# Patient Record
Sex: Male | Born: 1980 | Race: White | Hispanic: No | Smoking: Former smoker
Health system: Southern US, Community
[De-identification: ages and names within clinical notes are randomized; demographics above are authoritative.]

## PROBLEM LIST (undated history)

## (undated) DIAGNOSIS — F32A Depression, unspecified: Secondary | ICD-10-CM

## (undated) DIAGNOSIS — R569 Unspecified convulsions: Secondary | ICD-10-CM

## (undated) DIAGNOSIS — F419 Anxiety disorder, unspecified: Secondary | ICD-10-CM

## (undated) DIAGNOSIS — F329 Major depressive disorder, single episode, unspecified: Secondary | ICD-10-CM

## (undated) HISTORY — PX: OTHER SURGICAL HISTORY: SHX169

---

## 2003-07-07 ENCOUNTER — Other Ambulatory Visit: Payer: Self-pay

## 2005-02-12 ENCOUNTER — Emergency Department: Payer: Self-pay | Admitting: Unknown Physician Specialty

## 2005-03-05 ENCOUNTER — Inpatient Hospital Stay: Payer: Self-pay | Admitting: Psychiatry

## 2005-04-12 ENCOUNTER — Emergency Department: Payer: Self-pay | Admitting: General Practice

## 2006-05-25 ENCOUNTER — Emergency Department: Payer: Self-pay | Admitting: Internal Medicine

## 2006-05-25 ENCOUNTER — Other Ambulatory Visit: Payer: Self-pay

## 2007-05-17 ENCOUNTER — Other Ambulatory Visit: Payer: Self-pay

## 2007-05-17 ENCOUNTER — Emergency Department: Payer: Self-pay | Admitting: Emergency Medicine

## 2007-12-04 ENCOUNTER — Emergency Department: Payer: Self-pay | Admitting: Emergency Medicine

## 2008-12-02 ENCOUNTER — Emergency Department: Payer: Self-pay | Admitting: Internal Medicine

## 2009-11-30 ENCOUNTER — Emergency Department: Payer: Self-pay | Admitting: Emergency Medicine

## 2010-05-03 ENCOUNTER — Emergency Department: Payer: Self-pay | Admitting: Emergency Medicine

## 2010-10-14 ENCOUNTER — Emergency Department: Payer: Self-pay | Admitting: *Deleted

## 2011-02-17 ENCOUNTER — Emergency Department: Payer: Self-pay | Admitting: Unknown Physician Specialty

## 2012-08-21 ENCOUNTER — Emergency Department: Payer: Self-pay | Admitting: Emergency Medicine

## 2012-08-21 LAB — COMPREHENSIVE METABOLIC PANEL
Albumin: 4.2 g/dL (ref 3.4–5.0)
Alkaline Phosphatase: 79 U/L (ref 50–136)
BUN: 12 mg/dL (ref 7–18)
Bilirubin,Total: 0.3 mg/dL (ref 0.2–1.0)
Chloride: 109 mmol/L — ABNORMAL HIGH (ref 98–107)
Co2: 25 mmol/L (ref 21–32)
EGFR (African American): >60
Glucose: 105 mg/dL — ABNORMAL HIGH (ref 65–99)
Osmolality: 281 (ref 275–301)
Potassium: 3.2 mmol/L — ABNORMAL LOW (ref 3.5–5.1)
SGPT (ALT): 24 U/L (ref 12–78)
Sodium: 141 mmol/L (ref 136–145)

## 2012-08-21 LAB — CBC
HGB: 13.9 g/dL (ref 13.0–18.0)
MCHC: 34.5 g/dL (ref 32.0–36.0)
RBC: 4.66 10*6/uL (ref 4.40–5.90)
RDW: 13.2 % (ref 11.5–14.5)

## 2012-08-22 LAB — URINALYSIS, COMPLETE
Glucose,UR: NEGATIVE mg/dL (ref 0–75)
Ketone: NEGATIVE
Leukocyte Esterase: NEGATIVE
Nitrite: NEGATIVE
Ph: 6 (ref 4.5–8.0)
Protein: NEGATIVE
WBC UR: <1 /HPF (ref 0–5)

## 2012-08-22 LAB — DRUG SCREEN, URINE
Barbiturates, Ur Screen: NEGATIVE (ref ?–200)
Benzodiazepine, Ur Scrn: NEGATIVE (ref ?–200)
Cocaine Metabolite,Ur ~~LOC~~: NEGATIVE (ref ?–300)
Methadone, Ur Screen: NEGATIVE (ref ?–300)
Opiate, Ur Screen: NEGATIVE (ref ?–300)
Phencyclidine (PCP) Ur S: NEGATIVE (ref ?–25)

## 2013-01-24 ENCOUNTER — Emergency Department: Payer: Self-pay | Admitting: Emergency Medicine

## 2013-01-24 LAB — CBC
HCT: 42 % (ref 40.0–52.0)
HGB: 14.5 g/dL (ref 13.0–18.0)
MCHC: 34.6 g/dL (ref 32.0–36.0)
MCV: 87 fL (ref 80–100)
Platelet: 251 10*3/uL (ref 150–440)
RBC: 4.84 10*6/uL (ref 4.40–5.90)
RDW: 13.2 % (ref 11.5–14.5)
WBC: 7.9 10*3/uL (ref 3.8–10.6)

## 2013-01-24 LAB — BASIC METABOLIC PANEL
BUN: 9 mg/dL (ref 7–18)
Calcium, Total: 8.3 mg/dL — ABNORMAL LOW (ref 8.5–10.1)
Co2: 25 mmol/L (ref 21–32)
Creatinine: 1.15 mg/dL (ref 0.60–1.30)
EGFR (African American): >60
EGFR (Non-African Amer.): >60
Glucose: 114 mg/dL — ABNORMAL HIGH (ref 65–99)
Potassium: 3.3 mmol/L — ABNORMAL LOW (ref 3.5–5.1)
Sodium: 138 mmol/L (ref 136–145)
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
## 2014-06-13 NOTE — Consult Note (Signed)
PATIENT NAME:  Nicholas Reese, [name] MR#:  [id number] DATE OF BIRTH:  02/06/1981  DATE OF CONSULTATION:  08/22/2012  REFERRING PHYSICIAN:   CONSULTING PHYSICIAN:  Izola PriceFrances C. Jaclynn MajorGreason, MD  ADDENDUM:   PLAN:  Mr. Fletcher AnonChristopher Perry Vicars will be discharged home to his wife, who is satisfied with that. She says that what she believes is he needs outpatient follow-up. She does not think he is suicidal. She will help him with all of this disposition. We have helped him with appointment follow-up.    ____________________________ Izola PriceFrances C. Jaclynn MajorGreason, MD fcg:cc D: 08/22/2012 15:50:44 ET T: 08/22/2012 17:04:26 ET JOB#: [id number]  cc: Izola PriceFrances C. Jaclynn MajorGreason, MD, <Dictator> Maryan PulsFRANCES C Denasia Venn MD ELECTRONICALLY SIGNED 08/23/2012 13:23
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
## 2015-01-07 ENCOUNTER — Emergency Department
Admission: EM | Admit: 2015-01-07 | Discharge: 2015-01-07 | Disposition: A | Discharge status: Home / Self Care | Payer: No Typology Code available for payment source | Attending: Emergency Medicine | Admitting: Emergency Medicine

## 2015-01-07 ENCOUNTER — Emergency Department: Payer: No Typology Code available for payment source

## 2015-01-07 DIAGNOSIS — S3992XD Unspecified injury of lower back, subsequent encounter: Secondary | ICD-10-CM | POA: Diagnosis present

## 2015-01-07 DIAGNOSIS — Z87891 Personal history of nicotine dependence: Secondary | ICD-10-CM | POA: Diagnosis not present

## 2015-01-07 DIAGNOSIS — S4991XD Unspecified injury of right shoulder and upper arm, subsequent encounter: Secondary | ICD-10-CM | POA: Diagnosis not present

## 2015-01-07 DIAGNOSIS — M7918 Myalgia, other site: Secondary | ICD-10-CM
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
## 2015-01-07 MED ORDER — CYCLOBENZAPRINE HCL 10 MG PO TABS: 10.0000 mg | ORAL_TABLET | Freq: Three times a day (TID) | ORAL | Status: DC | PRN

## 2015-01-07 MED ORDER — TRAMADOL HCL 50 MG PO TABS: 50.0000 mg | ORAL_TABLET | Freq: Four times a day (QID) | ORAL | Status: DC | PRN
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
## 2015-01-07 NOTE — Discharge Instructions (Signed)

## 2015-11-02 ENCOUNTER — Encounter: Payer: Self-pay | Admitting: Emergency Medicine

## 2015-11-02 ENCOUNTER — Emergency Department
Admission: EM | Admit: 2015-11-02 | Discharge: 2015-11-02 | Disposition: A | Discharge status: Home / Self Care | Payer: Self-pay | Attending: Emergency Medicine | Admitting: Emergency Medicine

## 2015-11-02 ENCOUNTER — Emergency Department: Payer: Self-pay

## 2015-11-02 DIAGNOSIS — R569 Unspecified convulsions: Secondary | ICD-10-CM

## 2015-11-02 DIAGNOSIS — F419 Anxiety disorder, unspecified: Secondary | ICD-10-CM | POA: Insufficient documentation

## 2015-11-02 DIAGNOSIS — R0789 Other chest pain: Secondary | ICD-10-CM | POA: Insufficient documentation

## 2015-11-02 DIAGNOSIS — G40909 Epilepsy, unspecified, not intractable, without status epilepticus: Secondary | ICD-10-CM | POA: Insufficient documentation

## 2015-11-02 DIAGNOSIS — F17210 Nicotine dependence, cigarettes, uncomplicated: Secondary | ICD-10-CM | POA: Insufficient documentation

## 2015-11-02 HISTORY — DX: Anxiety disorder, unspecified: F41.9

## 2015-11-02 HISTORY — DX: Unspecified convulsions: R56.9

## 2015-11-02 LAB — COMPREHENSIVE METABOLIC PANEL
ALBUMIN: 4.4 g/dL (ref 3.5–5.0)
ALK PHOS: 57 U/L (ref 38–126)
ALT: 14 U/L — AB (ref 17–63)
AST: 20 U/L (ref 15–41)
Anion gap: 5 (ref 5–15)
BUN: 12 mg/dL (ref 6–20)
CALCIUM: 8.8 mg/dL — AB (ref 8.9–10.3)
CO2: 25 mmol/L (ref 22–32)
CREATININE: 1.05 mg/dL (ref 0.61–1.24)
Chloride: 107 mmol/L (ref 101–111)
GFR calc Af Amer: >60 mL/min (ref >60)
GFR calc non Af Amer: >60 mL/min (ref >60)
GLUCOSE: 96 mg/dL (ref 65–99)
Potassium: 3.3 mmol/L — ABNORMAL LOW (ref 3.5–5.1)
SODIUM: 137 mmol/L (ref 135–145)
Total Bilirubin: 0.5 mg/dL (ref 0.3–1.2)
Total Protein: 6.9 g/dL (ref 6.5–8.1)

## 2015-11-02 LAB — CBC
HEMATOCRIT: 43.4 % (ref 40.0–52.0)
HEMOGLOBIN: 14.9 g/dL (ref 13.0–18.0)
MCH: 32.9 pg (ref 26.0–34.0)
MCHC: 34.3 g/dL (ref 32.0–36.0)
MCV: 96 fL (ref 80.0–100.0)
Platelets: 217 10*3/uL (ref 150–440)
RBC: 4.52 MIL/uL (ref 4.40–5.90)
RDW: 14.1 % (ref 11.5–14.5)
WBC: 4.9 10*3/uL (ref 3.8–10.6)

## 2015-11-02 LAB — TROPONIN I: Troponin I: <0.03 ng/mL (ref <0.03)
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
## 2015-11-02 MED ORDER — LEVETIRACETAM 500 MG PO TABS
1000.0000 mg | ORAL_TABLET | Freq: Once | ORAL | Status: AC
  Administered 2015-11-02: 1000 mg via ORAL
  Filled 2015-11-02: qty 2

## 2015-11-02 MED ORDER — LORAZEPAM 1 MG PO TABS: 1.0000 mg | ORAL_TABLET | Freq: Two times a day (BID) | ORAL | 0 refills | Status: AC

## 2015-11-02 MED ORDER — LORAZEPAM 2 MG/ML IJ SOLN
1.0000 mg | Freq: Once | INTRAMUSCULAR | Status: AC
  Administered 2015-11-02: 1 mg via INTRAVENOUS
  Filled 2015-11-02: qty 1

## 2015-11-02 MED ORDER — LEVETIRACETAM 500 MG PO TABS: 500.0000 mg | ORAL_TABLET | Freq: Two times a day (BID) | ORAL | 2 refills | Status: DC
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
## 2015-11-02 NOTE — ED Triage Notes (Signed)
Patient presents to the ED stating, "I think I had a seizure."  Patient reports history of seizures but states he hasn't had one in 10 years.  Patient states he stopped taking Keppra 2 months ago.  Patient is also complaining of chest pain.  Patient states no one witnessed the seizure but that patient felt his muscles had gotten very tight and patient was incontinent of urine.

## 2015-12-21 ENCOUNTER — Emergency Department: Payer: Self-pay

## 2015-12-21 ENCOUNTER — Encounter: Payer: Self-pay | Admitting: Emergency Medicine

## 2015-12-21 ENCOUNTER — Emergency Department
Admission: EM | Admit: 2015-12-21 | Discharge: 2015-12-21 | Disposition: A | Discharge status: Home / Self Care | Payer: Self-pay | Attending: Emergency Medicine | Admitting: Emergency Medicine

## 2015-12-21 DIAGNOSIS — R569 Unspecified convulsions: Secondary | ICD-10-CM

## 2015-12-21 DIAGNOSIS — R079 Chest pain, unspecified: Secondary | ICD-10-CM | POA: Insufficient documentation

## 2015-12-21 DIAGNOSIS — G40909 Epilepsy, unspecified, not intractable, without status epilepticus: Secondary | ICD-10-CM | POA: Insufficient documentation

## 2015-12-21 DIAGNOSIS — Z87891 Personal history of nicotine dependence: Secondary | ICD-10-CM | POA: Insufficient documentation

## 2015-12-21 LAB — CBC
HEMATOCRIT: 42.5 % (ref 40.0–52.0)
HEMOGLOBIN: 14.8 g/dL (ref 13.0–18.0)
MCH: 32.6 pg (ref 26.0–34.0)
MCHC: 34.7 g/dL (ref 32.0–36.0)
MCV: 93.9 fL (ref 80.0–100.0)
Platelets: 234 10*3/uL (ref 150–440)
RBC: 4.53 MIL/uL (ref 4.40–5.90)
RDW: 13.3 % (ref 11.5–14.5)
WBC: 5.4 10*3/uL (ref 3.8–10.6)

## 2015-12-21 LAB — COMPREHENSIVE METABOLIC PANEL
ALBUMIN: 4.6 g/dL (ref 3.5–5.0)
ALT: 14 U/L — AB (ref 17–63)
AST: 19 U/L (ref 15–41)
Alkaline Phosphatase: 71 U/L (ref 38–126)
Anion gap: 6 (ref 5–15)
BUN: 11 mg/dL (ref 6–20)
CHLORIDE: 108 mmol/L (ref 101–111)
CO2: 23 mmol/L (ref 22–32)
CREATININE: 0.93 mg/dL (ref 0.61–1.24)
Calcium: 9 mg/dL (ref 8.9–10.3)
GFR calc non Af Amer: >60 mL/min (ref >60)
GLUCOSE: 99 mg/dL (ref 65–99)
Potassium: 3.7 mmol/L (ref 3.5–5.1)
SODIUM: 137 mmol/L (ref 135–145)
Total Bilirubin: 0.3 mg/dL (ref 0.3–1.2)
Total Protein: 7.5 g/dL (ref 6.5–8.1)

## 2015-12-21 LAB — TROPONIN I
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
## 2015-12-21 MED ORDER — LEVETIRACETAM 500 MG PO TABS
1000.0000 mg | ORAL_TABLET | Freq: Once | ORAL | Status: AC
  Administered 2015-12-21: 1000 mg via ORAL
  Filled 2015-12-21: qty 2

## 2015-12-21 MED ORDER — PHENYTOIN SODIUM EXTENDED 100 MG PO CAPS: 100.0000 mg | ORAL_CAPSULE | Freq: Three times a day (TID) | ORAL | 2 refills | Status: DC

## 2015-12-21 NOTE — ED Triage Notes (Signed)
Reports "a few seizures last night and one this morning".  This morning patient lost control of bladder with seizure.  Patient reports he has been having seizures for the past couple of weeks,  More frequent in the evenings.  Patient has been off of seizure medications for about two weeks due to loss of insurance.    Patient had been prescribed 1000 mg Keppra BID
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
## 2016-06-20 ENCOUNTER — Emergency Department
Admission: EM | Admit: 2016-06-20 | Discharge: 2016-06-20 | Disposition: A | Discharge status: Home / Self Care | Payer: Self-pay | Attending: Emergency Medicine | Admitting: Emergency Medicine

## 2016-06-20 ENCOUNTER — Emergency Department: Payer: Self-pay

## 2016-06-20 ENCOUNTER — Encounter: Payer: Self-pay | Admitting: Emergency Medicine

## 2016-06-20 DIAGNOSIS — R55 Syncope and collapse: Secondary | ICD-10-CM | POA: Insufficient documentation

## 2016-06-20 DIAGNOSIS — R0789 Other chest pain: Secondary | ICD-10-CM | POA: Insufficient documentation

## 2016-06-20 DIAGNOSIS — R079 Chest pain, unspecified: Secondary | ICD-10-CM

## 2016-06-20 LAB — TROPONIN I: Troponin I: <0.03 ng/mL (ref <0.03)

## 2016-06-20 LAB — CBC
HCT: 39.4 % — ABNORMAL LOW (ref 40.0–52.0)
Hemoglobin: 13.4 g/dL (ref 13.0–18.0)
MCH: 33.5 pg (ref 26.0–34.0)
MCHC: 34.1 g/dL (ref 32.0–36.0)
MCV: 98.1 fL (ref 80.0–100.0)
PLATELETS: 214 10*3/uL (ref 150–440)
RBC: 4.01 MIL/uL — AB (ref 4.40–5.90)
RDW: 13.8 % (ref 11.5–14.5)
WBC: 5.1 10*3/uL (ref 3.8–10.6)

## 2016-06-20 LAB — BASIC METABOLIC PANEL
Anion gap: 5 (ref 5–15)
BUN: 12 mg/dL (ref 6–20)
CALCIUM: 8.6 mg/dL — AB (ref 8.9–10.3)
CO2: 28 mmol/L (ref 22–32)
Chloride: 106 mmol/L (ref 101–111)
Creatinine, Ser: 1.01 mg/dL (ref 0.61–1.24)
GFR calc non Af Amer: >60 mL/min (ref >60)
GLUCOSE: 108 mg/dL — AB (ref 65–99)
Potassium: 3.6 mmol/L (ref 3.5–5.1)
Sodium: 139 mmol/L (ref 135–145)

## 2016-06-20 LAB — MAGNESIUM: Magnesium: 1.9 mg/dL (ref 1.7–2.4)

## 2016-06-20 LAB — TSH: TSH: 1.376 u[IU]/mL (ref 0.350–4.500)

## 2016-06-20 IMAGING — CR DG CHEST 2V
1 series · 2 of 2 positions shown · non-contrast
Comparison: [DATE]

CLINICAL DATA: Chest pain for a week, worsened in the past 24
hours.

EXAM:
CHEST  2 VIEW

[Series 1: dg chest 2 view · 0.14mm/px · 2 of 2 slices shown]
[im 1/2]
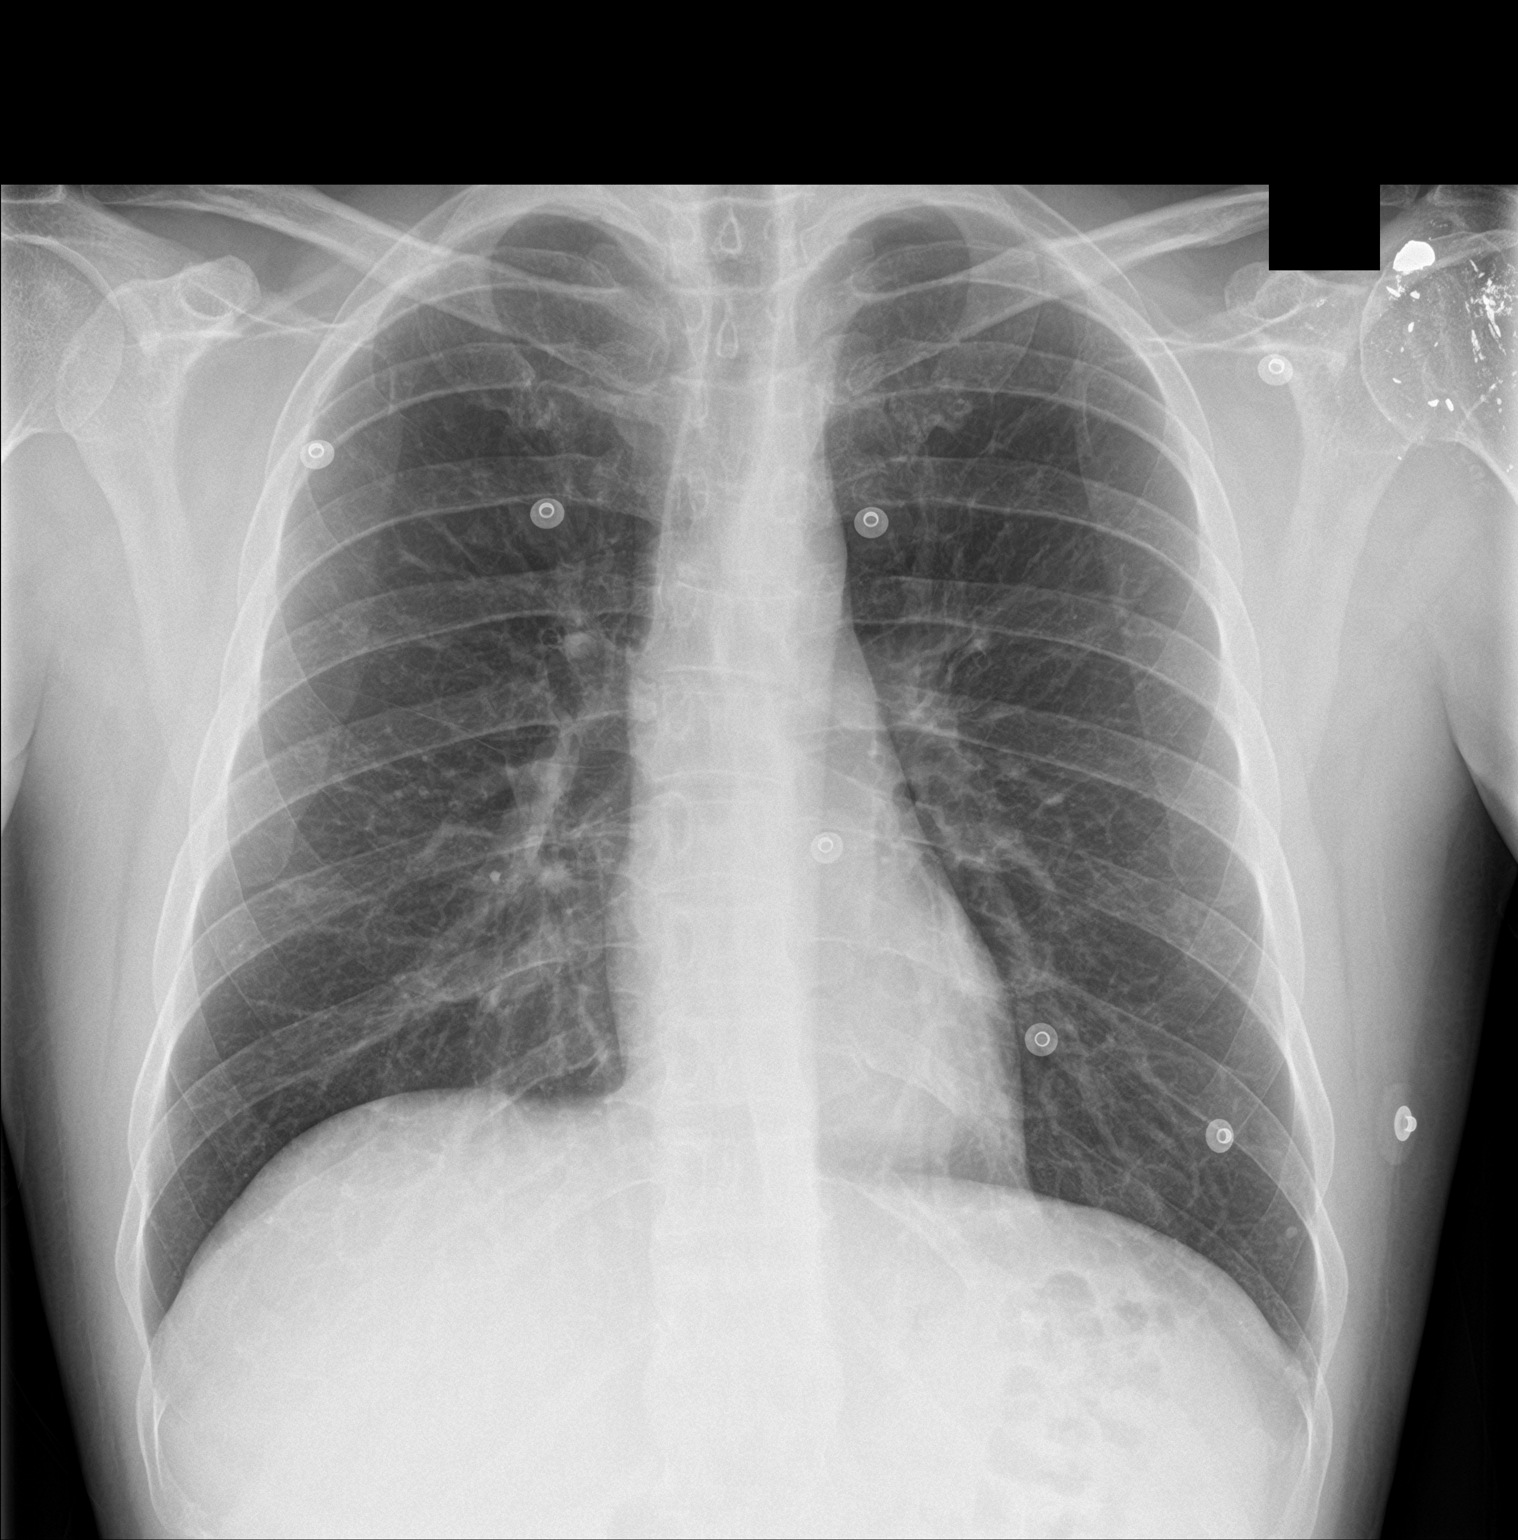
[im 2/2]
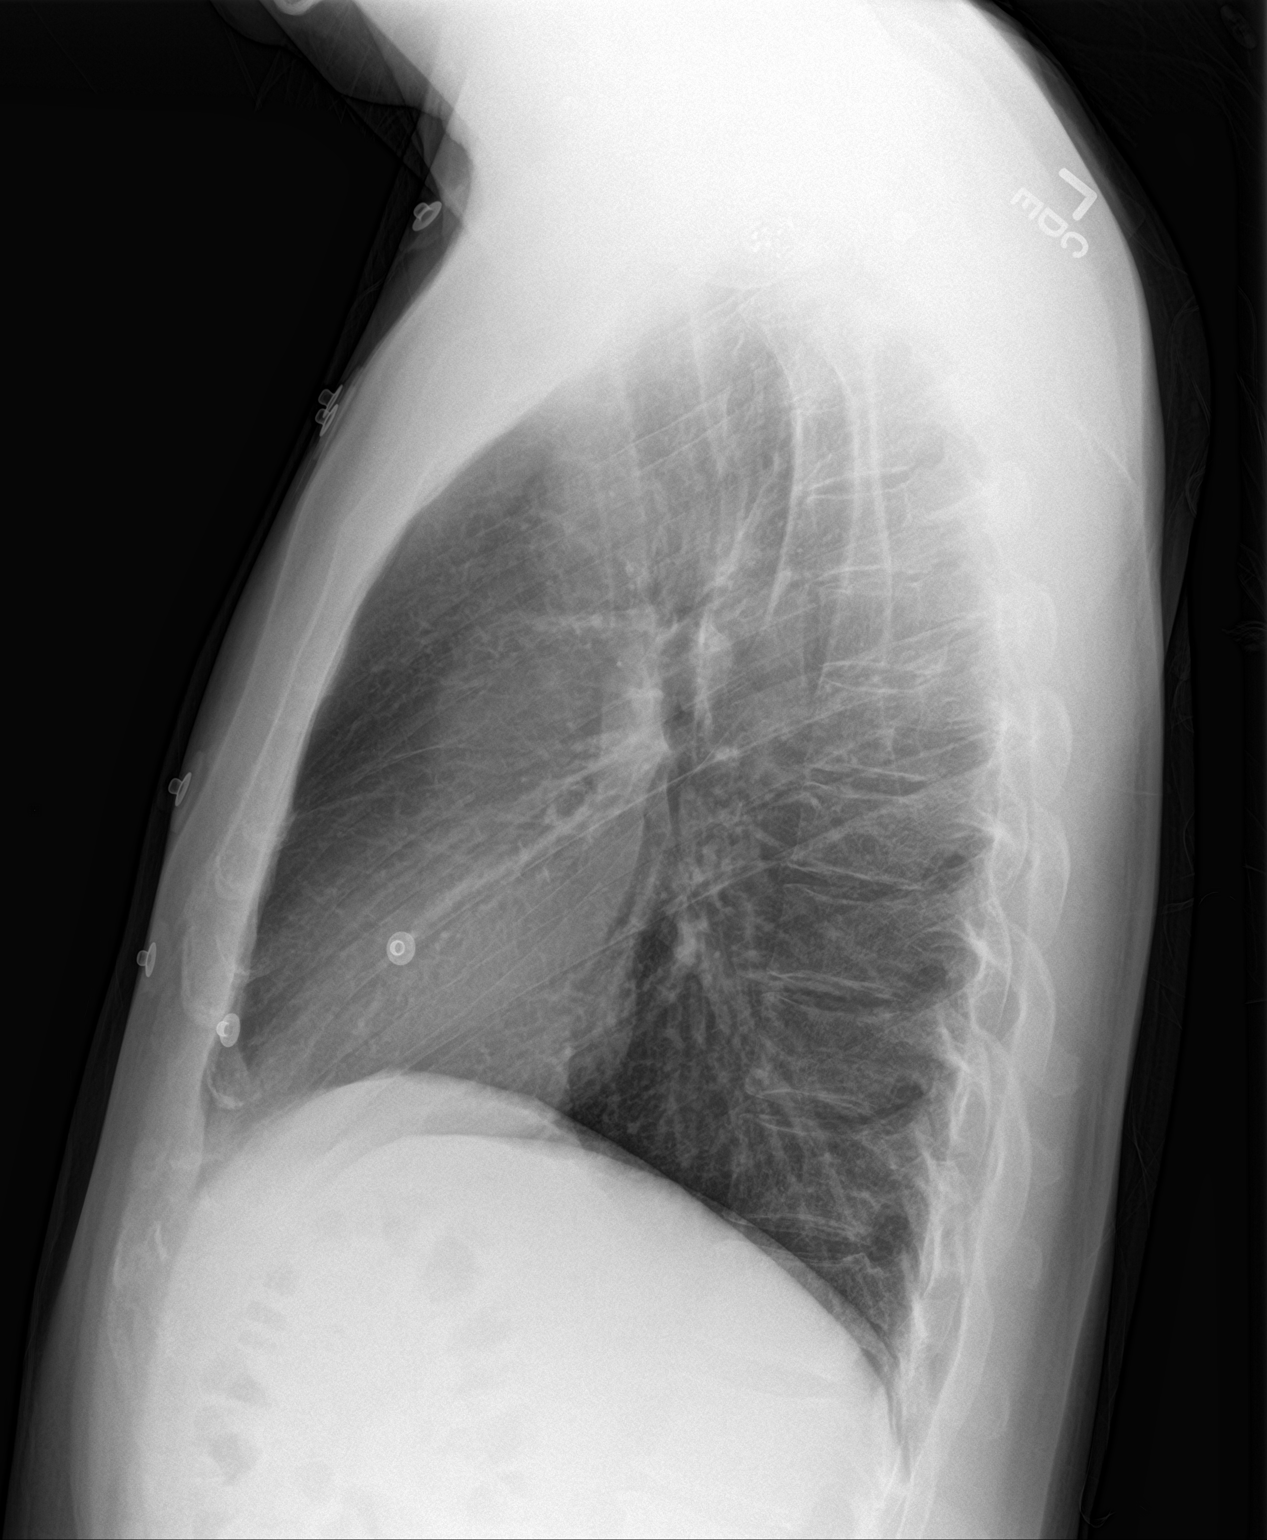

[2 of 2 positions shown; findings below may reference images not displayed]

FINDINGS: The lungs are clear. The pulmonary vasculature is normal. Heart size
is normal. Hilar and mediastinal contours are unremarkable. There is
no pleural effusion.
IMPRESSION: No active cardiopulmonary disease.

## 2016-06-20 IMAGING — CT CT HEAD W/O CM
3 series · 15 of 47 positions shown, 18 images · non-contrast
Comparison: None.

CLINICAL DATA: Syncope. Chest pain for week, and atrial
fibrillation. History of seizures, off medication.

EXAM:
CT HEAD WITHOUT CONTRAST
TECHNIQUE: Contiguous axial images were obtained from the base of the skull
through the vertex without intravenous contrast.

[Series 2: head wo · axial · 0.40mm/px · z∈[-111,+14]mm · 9 of 31 slices shown, 12 images]
[im 3/31  brain]
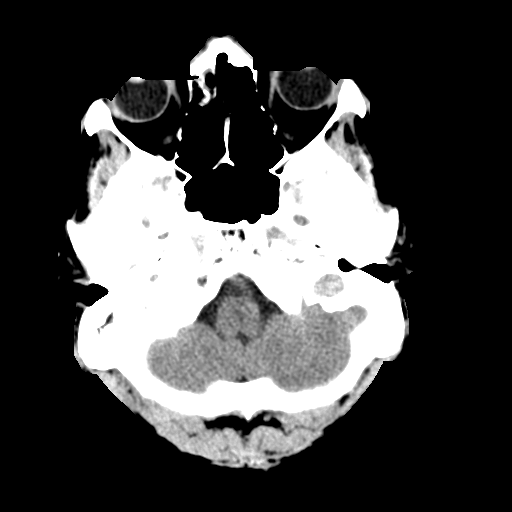
[im 3/31  bone]
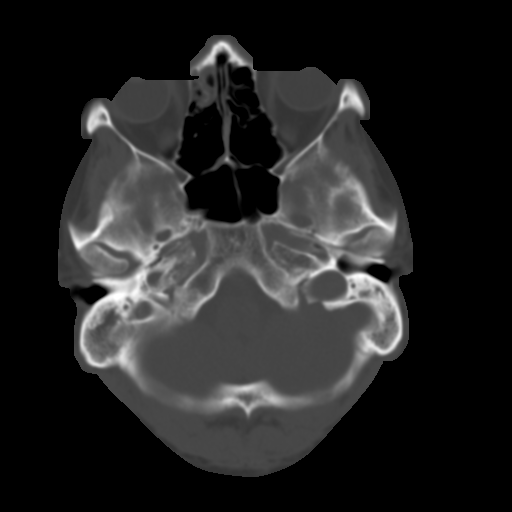
[im 6/31  brain]
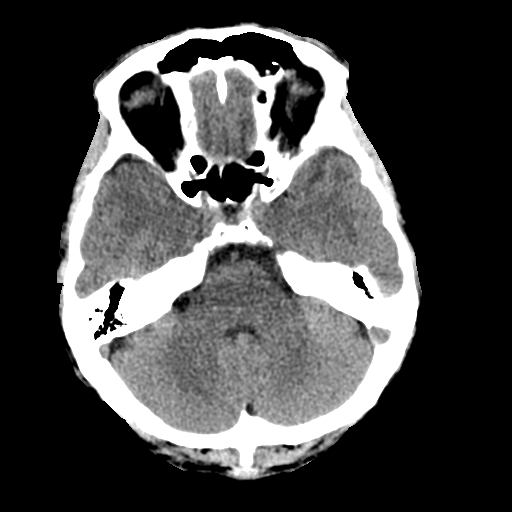
[im 9/31  brain]
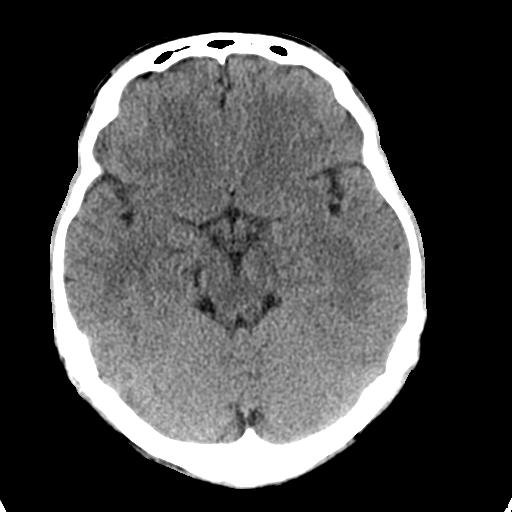
[im 12/31  brain]
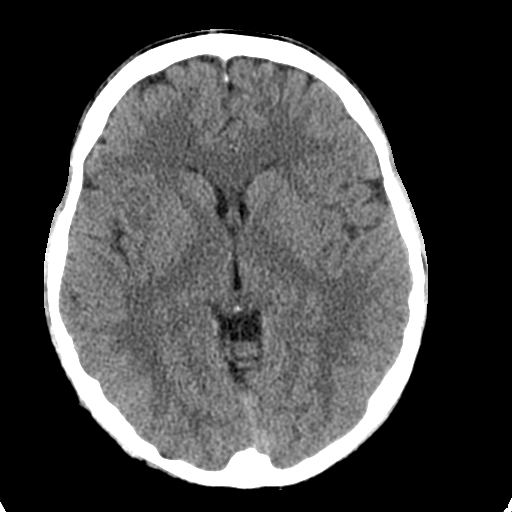
[im 16/31  brain]
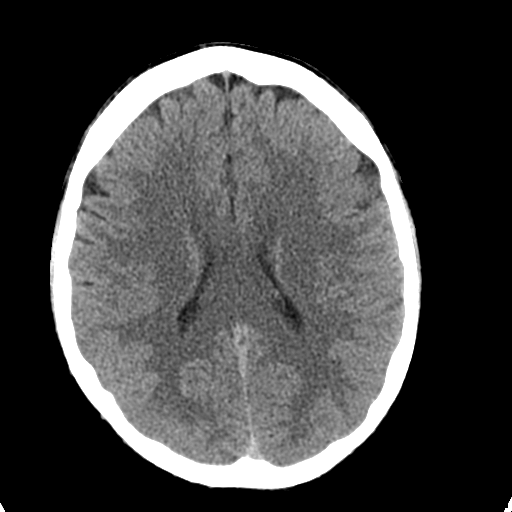
[im 16/31  bone]
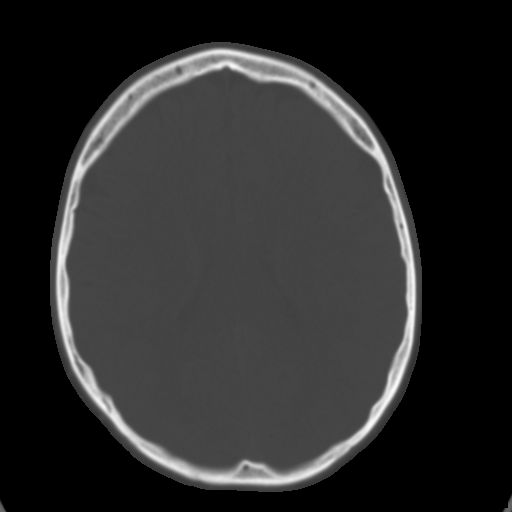
[im 19/31  brain]
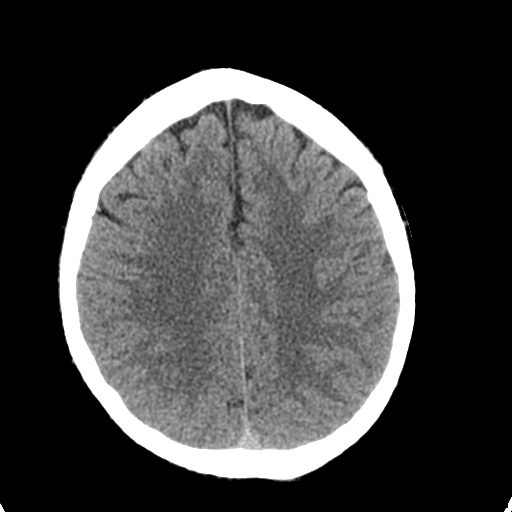
[im 22/31  brain]
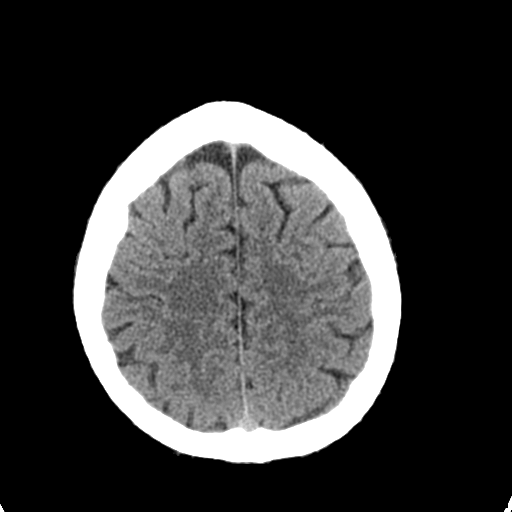
[im 25/31  brain]
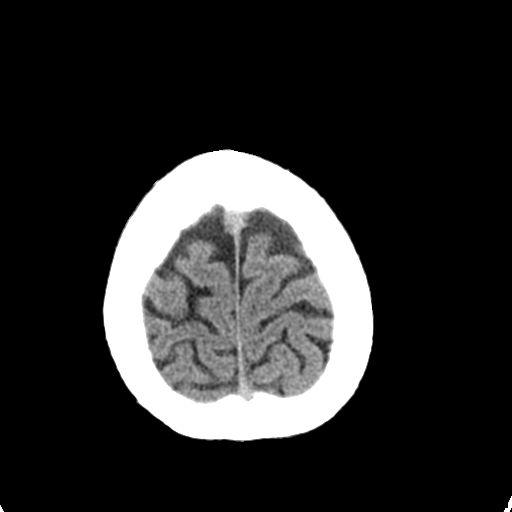
[im 28/31  brain]
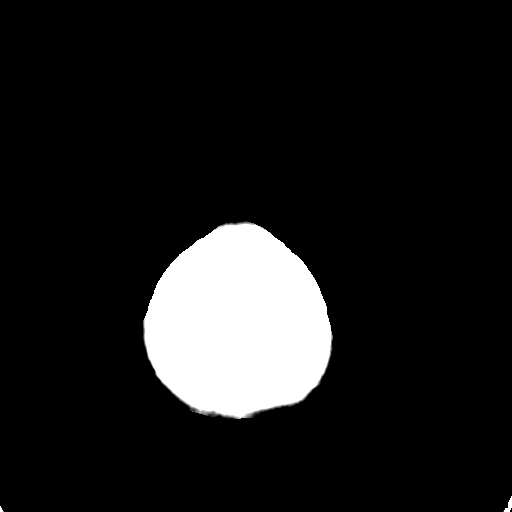
[im 28/31  bone]
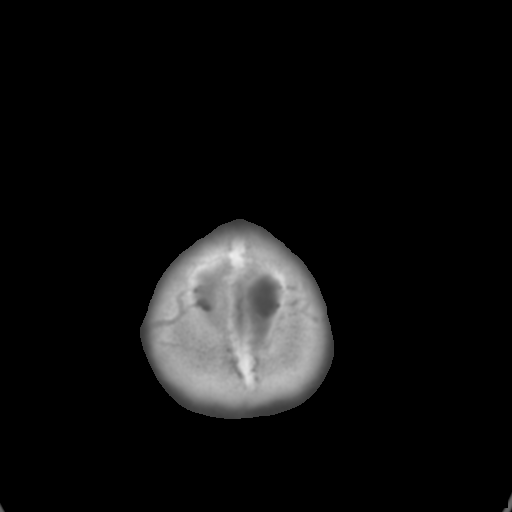

[Series 4: coronal soft tissue · coronal · 0.29mm/px · 3 of 67 slices shown]
[im 23/67  brain]
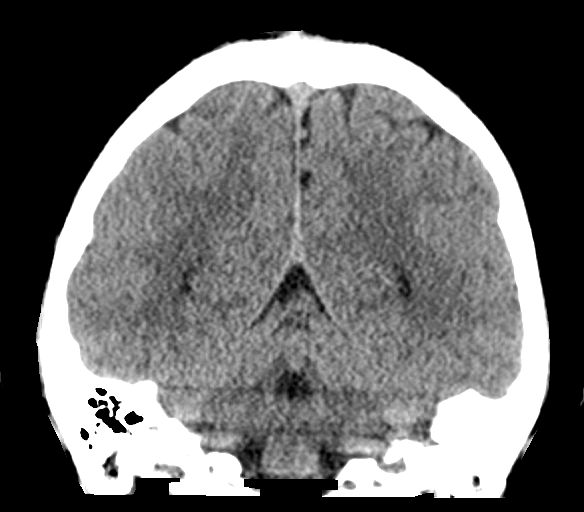
[im 30/67  brain]
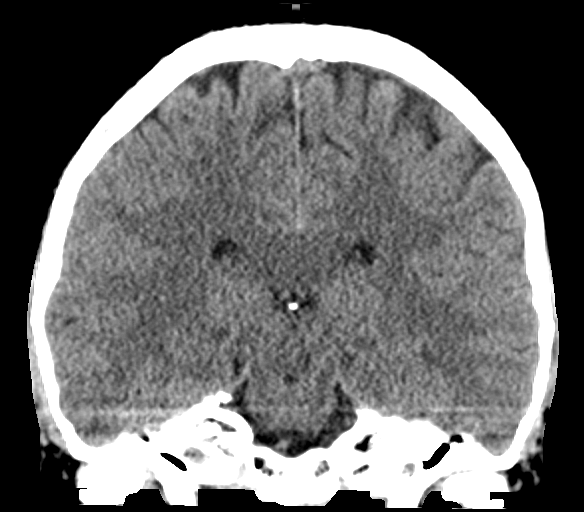
[im 37/67  brain]
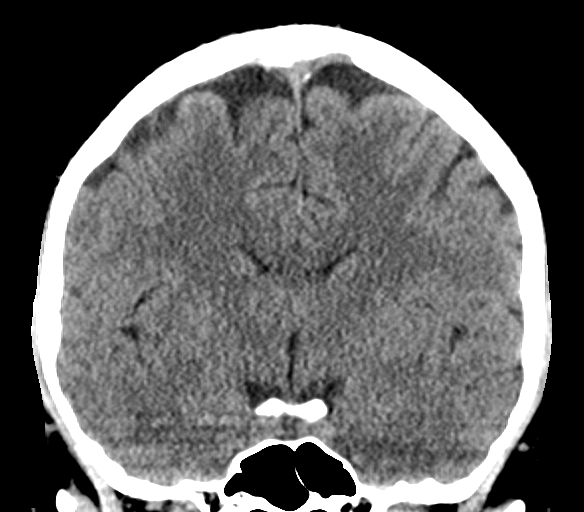

[Series 5: sagittal soft tissue · sagittal · 0.29mm/px · 3 of 57 slices shown]
[im 19/57  brain]
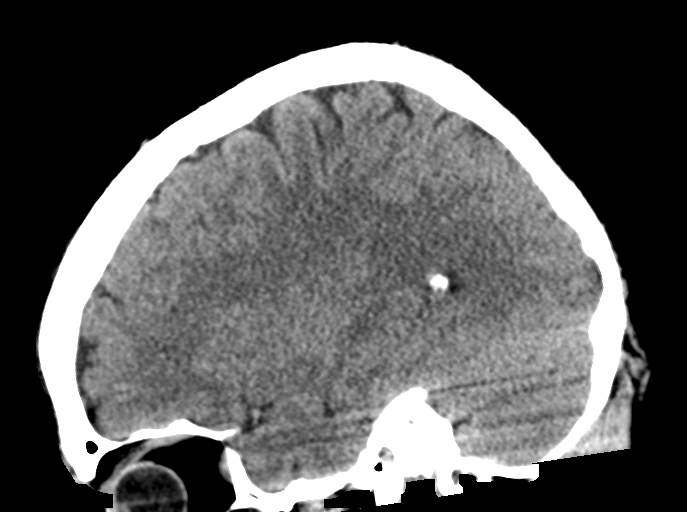
[im 29/57  brain]
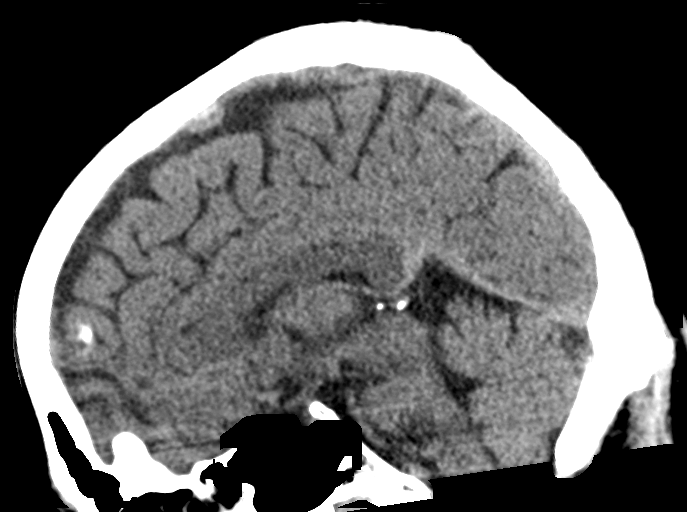
[im 38/57  brain]
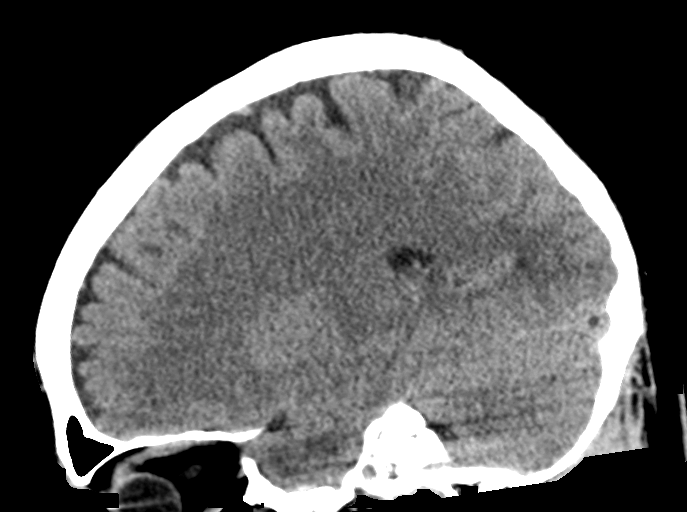

[15 of 47 positions shown; findings below may reference images not displayed]

FINDINGS: BRAIN: No intraparenchymal hemorrhage, mass effect nor midline
shift. The ventricles and sulci are normal. No acute large vascular
territory infarcts. No abnormal extra-axial fluid collections. Basal
cisterns are patent.

VASCULAR: Unremarkable.

SKULL/SOFT TISSUES: No skull fracture. No significant soft tissue
swelling.

ORBITS/SINUSES: The included ocular globes and orbital contents are
normal.Mild paranasal sinus mucosal thickening without air-fluid
levels. Mastoid air cells are well aerated.

OTHER: None.
IMPRESSION: Normal noncontrast CT HEAD.

## 2016-06-20 MED ORDER — ASPIRIN 81 MG PO CHEW
324.0000 mg | CHEWABLE_TABLET | Freq: Once | ORAL | Status: AC
  Administered 2016-06-20: 324 mg via ORAL
  Filled 2016-06-20: qty 4

## 2016-06-20 NOTE — ED Triage Notes (Signed)
Pt. States he is unsure exactly what happened tonight.  Pt. States chest pain for the past 5-7 days worse in the last 24 hours.  EMS report pt. Was in A-fib on arrival.  Pt. Given 500 NS bolus, pt. Converted back to normal sinus rhythm before arriving to ED.

## 2016-06-20 NOTE — ED Provider Notes (Addendum)
Signout from Dr. Zenda Alpers in this 36 year old male with chest pain and seizure versus syncope. Patient is off his Keppra and is pending a second troponin at this time. Also with report of A. fib per EMS prior to arrival.  Physical Exam  BP 106/78   Pulse 65   Temp 98.2 F (36.8 C) (Oral)   Resp 16   Ht 6' (1.829 m)   Wt 160 lb (72.6 kg)   SpO2 100%   BMI 21.70 kg/m  ----------------------------------------- 8:43 AM on 06/20/2016 -----------------------------------------   Physical Exam Patient without any chest pain at this time. Sleeping in the room without any complaints. ED Course  Procedures  MDM Patient knows that he must resume his Keppra. He also notes that he must not drive until he is seizure/syncope 3. He says that he has seen a cardiologist in the past on following up with Dr. Welton Flakes.  We discussed need for possible Holter monitor.       Myrna Blazer, MD 06/20/16 [phone] Patient continues to be without any complaints at this time. Second troponin was negative. Will be discharged home.   Myrna Blazer, MD 06/20/16 [phone]

## 2016-06-20 NOTE — ED Notes (Signed)
Pt. States chest pain for the past couple days worse in the past 24 hours.  Pt. States he can not remember all that happened this evening.  Pt. States he stopped taking Keppra about 3 months ago.
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
## 2016-06-23 ENCOUNTER — Emergency Department
Admission: EM | Admit: 2016-06-23 | Discharge: 2016-06-23 | Disposition: A | Discharge status: Home / Self Care | Payer: Self-pay | Attending: Emergency Medicine | Admitting: Emergency Medicine

## 2016-06-23 ENCOUNTER — Emergency Department: Payer: Self-pay

## 2016-06-23 ENCOUNTER — Encounter: Payer: Self-pay | Admitting: Emergency Medicine

## 2016-06-23 DIAGNOSIS — Z5181 Encounter for therapeutic drug level monitoring: Secondary | ICD-10-CM | POA: Insufficient documentation

## 2016-06-23 DIAGNOSIS — Z87891 Personal history of nicotine dependence: Secondary | ICD-10-CM | POA: Insufficient documentation

## 2016-06-23 DIAGNOSIS — F329 Major depressive disorder, single episode, unspecified: Secondary | ICD-10-CM | POA: Insufficient documentation

## 2016-06-23 DIAGNOSIS — F32A Depression, unspecified: Secondary | ICD-10-CM

## 2016-06-23 DIAGNOSIS — T443X1A Poisoning by other parasympatholytics [anticholinergics and antimuscarinics] and spasmolytics, accidental (unintentional), initial encounter: Secondary | ICD-10-CM

## 2016-06-23 DIAGNOSIS — F411 Generalized anxiety disorder: Secondary | ICD-10-CM

## 2016-06-23 DIAGNOSIS — R079 Chest pain, unspecified: Secondary | ICD-10-CM | POA: Insufficient documentation

## 2016-06-23 HISTORY — DX: Major depressive disorder, single episode, unspecified: F32.9

## 2016-06-23 HISTORY — DX: Depression, unspecified: F32.A

## 2016-06-23 LAB — COMPREHENSIVE METABOLIC PANEL
ALT: 11 U/L — AB (ref 17–63)
AST: 18 U/L (ref 15–41)
Albumin: 4.1 g/dL (ref 3.5–5.0)
Alkaline Phosphatase: 56 U/L (ref 38–126)
Anion gap: 5 (ref 5–15)
BUN: 10 mg/dL (ref 6–20)
CHLORIDE: 104 mmol/L (ref 101–111)
CO2: 26 mmol/L (ref 22–32)
CREATININE: 0.9 mg/dL (ref 0.61–1.24)
Calcium: 9 mg/dL (ref 8.9–10.3)
Glucose, Bld: 134 mg/dL — ABNORMAL HIGH (ref 65–99)
Potassium: 3.9 mmol/L (ref 3.5–5.1)
Sodium: 135 mmol/L (ref 135–145)
Total Bilirubin: 0.5 mg/dL (ref 0.3–1.2)
Total Protein: 7.1 g/dL (ref 6.5–8.1)

## 2016-06-23 LAB — ACETAMINOPHEN LEVEL

## 2016-06-23 LAB — URINE DRUG SCREEN, QUALITATIVE (ARMC ONLY)
AMPHETAMINES, UR SCREEN: NOT DETECTED
BARBITURATES, UR SCREEN: NOT DETECTED
BENZODIAZEPINE, UR SCRN: POSITIVE — AB
CANNABINOID 50 NG, UR ~~LOC~~: NOT DETECTED
Cocaine Metabolite,Ur ~~LOC~~: NOT DETECTED
MDMA (Ecstasy)Ur Screen: NOT DETECTED
Methadone Scn, Ur: NOT DETECTED
Opiate, Ur Screen: NOT DETECTED
PHENCYCLIDINE (PCP) UR S: NOT DETECTED
Tricyclic, Ur Screen: NOT DETECTED

## 2016-06-23 LAB — PHENYTOIN LEVEL, TOTAL: Phenytoin Lvl: <2.5 ug/mL — ABNORMAL LOW (ref 10.0–20.0)

## 2016-06-23 LAB — ETHANOL

## 2016-06-23 LAB — CBC WITH DIFFERENTIAL/PLATELET
Basophils Absolute: 0 10*3/uL (ref 0–0.1)
Basophils Relative: 0 %
EOS PCT: 2 %
Eosinophils Absolute: 0.1 10*3/uL (ref 0–0.7)
HCT: 40.2 % (ref 40.0–52.0)
Hemoglobin: 13.7 g/dL (ref 13.0–18.0)
LYMPHS ABS: 1.6 10*3/uL (ref 1.0–3.6)
LYMPHS PCT: 25 %
MCH: 32.8 pg (ref 26.0–34.0)
MCHC: 34.2 g/dL (ref 32.0–36.0)
MCV: 96.1 fL (ref 80.0–100.0)
MONO ABS: 0.5 10*3/uL (ref 0.2–1.0)
MONOS PCT: 7 %
Neutro Abs: 4.3 10*3/uL (ref 1.4–6.5)
Neutrophils Relative %: 66 %
PLATELETS: 237 10*3/uL (ref 150–440)
RBC: 4.18 MIL/uL — AB (ref 4.40–5.90)
RDW: 13.2 % (ref 11.5–14.5)
WBC: 6.5 10*3/uL (ref 3.8–10.6)

## 2016-06-23 LAB — TROPONIN I

## 2016-06-23 LAB — SALICYLATE LEVEL: Salicylate Lvl: <7 mg/dL (ref 2.8–30.0)

## 2016-06-23 LAB — FIBRIN DERIVATIVES D-DIMER (ARMC ONLY): Fibrin derivatives D-dimer (ARMC): 78.33 (ref 0.00–499.00)
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
## 2016-06-23 MED ORDER — LEVETIRACETAM 500 MG PO TABS: 500.0000 mg | ORAL_TABLET | Freq: Two times a day (BID) | ORAL | 0 refills | Status: DC

## 2016-06-23 MED ORDER — LEVETIRACETAM 500 MG PO TABS
500.0000 mg | ORAL_TABLET | Freq: Once | ORAL | Status: AC
  Administered 2016-06-23: 500 mg via ORAL
  Filled 2016-06-23: qty 1

## 2016-06-23 MED ORDER — PHENYTOIN SODIUM EXTENDED 100 MG PO CAPS: 100.0000 mg | ORAL_CAPSULE | Freq: Three times a day (TID) | ORAL | Status: DC

## 2016-06-23 MED ORDER — LORAZEPAM 1 MG PO TABS
1.0000 mg | ORAL_TABLET | Freq: Once | ORAL | Status: AC
  Administered 2016-06-23: 1 mg via ORAL

## 2016-06-23 MED ORDER — LEVETIRACETAM 500 MG PO TABS: 500.0000 mg | ORAL_TABLET | Freq: Two times a day (BID) | ORAL | Status: DC

## 2016-06-23 MED ORDER — PHENYTOIN SODIUM EXTENDED 100 MG PO CAPS
300.0000 mg | ORAL_CAPSULE | Freq: Once | ORAL | Status: AC
  Administered 2016-06-23: 300 mg via ORAL
  Filled 2016-06-23: qty 3

## 2016-06-23 MED ORDER — ZIPRASIDONE MESYLATE 20 MG IM SOLR
10.0000 mg | Freq: Once | INTRAMUSCULAR | Status: AC
  Administered 2016-06-23: 10 mg via INTRAMUSCULAR

## 2016-06-23 MED ORDER — ZIPRASIDONE MESYLATE 20 MG IM SOLR
INTRAMUSCULAR | Status: AC
  Filled 2016-06-23: qty 20

## 2016-06-23 MED ORDER — ZIPRASIDONE MESYLATE 20 MG IM SOLR: 20.0000 mg | Freq: Once | INTRAMUSCULAR | Status: DC

## 2016-06-23 MED ORDER — LORAZEPAM 1 MG PO TABS
ORAL_TABLET | ORAL | Status: AC
  Administered 2016-06-23: 1 mg via ORAL
  Filled 2016-06-23: qty 1

## 2016-06-23 MED ORDER — CITALOPRAM HYDROBROMIDE 20 MG PO TABS: 20.0000 mg | ORAL_TABLET | Freq: Every day | ORAL | 1 refills | Status: DC
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
## 2016-06-23 NOTE — ED Notes (Signed)
ivc recended and pt discharged with instructions. Pt dressed and ready to go.

## 2016-06-23 NOTE — ED Notes (Signed)
Breakfast was given to patient. 

## 2016-06-23 NOTE — ED Triage Notes (Signed)
Pt arrived to the ED via EMS from home for complaints of chest pain and lethargy. Pt states that he took 3  (25mg ) benadryls and 4 tylenol PM tonight. Pt is sleepy upon arrival arausable by voice stimuly.Pt is AOx4 in no apparent distress.

## 2016-06-23 NOTE — ED Notes (Signed)
Patient in bathroom
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
## 2016-06-23 NOTE — ED Notes (Signed)
Pt voiced that he wanted to leave the ED without talking to the psychiatrist. Dr. Dolores FrameSung went to the Pt's room and explained that she did not feel comfortable letting him go therefore he will be IVC'd.

## 2016-06-23 NOTE — ED Notes (Signed)
[facility]

## 2016-06-23 NOTE — ED Notes (Signed)
Dr.clapaac in room with patient 
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
## 2016-06-23 NOTE — ED Notes (Signed)
Pt returned from X Ray.

## 2016-06-24 LAB — PHENYTOIN LEVEL, FREE AND TOTAL
PHENYTOIN FREE: NOT DETECTED ug/mL (ref 1.0–2.0)
PHENYTOIN, TOTAL: 2.6 ug/mL — AB (ref 10.0–20.0)

## 2017-04-06 ENCOUNTER — Encounter: Payer: Self-pay | Admitting: Emergency Medicine

## 2017-04-06 ENCOUNTER — Inpatient Hospital Stay
Admission: EM | Admit: 2017-04-06 | Discharge: 2017-04-08 | DRG: 195 | Disposition: A | Discharge status: Home / Self Care | Payer: Self-pay | Attending: Internal Medicine | Admitting: Internal Medicine

## 2017-04-06 ENCOUNTER — Emergency Department: Payer: Self-pay

## 2017-04-06 DIAGNOSIS — Z23 Encounter for immunization: Secondary | ICD-10-CM

## 2017-04-06 DIAGNOSIS — N3000 Acute cystitis without hematuria: Secondary | ICD-10-CM

## 2017-04-06 DIAGNOSIS — J101 Influenza due to other identified influenza virus with other respiratory manifestations: Secondary | ICD-10-CM

## 2017-04-06 DIAGNOSIS — J181 Lobar pneumonia, unspecified organism: Secondary | ICD-10-CM

## 2017-04-06 DIAGNOSIS — Z87891 Personal history of nicotine dependence: Secondary | ICD-10-CM

## 2017-04-06 DIAGNOSIS — Z885 Allergy status to narcotic agent status: Secondary | ICD-10-CM

## 2017-04-06 DIAGNOSIS — J189 Pneumonia, unspecified organism: Secondary | ICD-10-CM | POA: Diagnosis present

## 2017-04-06 DIAGNOSIS — J1000 Influenza due to other identified influenza virus with unspecified type of pneumonia: Principal | ICD-10-CM | POA: Diagnosis present

## 2017-04-06 DIAGNOSIS — J111 Influenza due to unidentified influenza virus with other respiratory manifestations: Secondary | ICD-10-CM | POA: Diagnosis present

## 2017-04-06 LAB — COMPREHENSIVE METABOLIC PANEL
ALT: 20 U/L (ref 17–63)
AST: 32 U/L (ref 15–41)
Albumin: 4.3 g/dL (ref 3.5–5.0)
Alkaline Phosphatase: 64 U/L (ref 38–126)
Anion gap: 13 (ref 5–15)
BILIRUBIN TOTAL: 0.5 mg/dL (ref 0.3–1.2)
BUN: 14 mg/dL (ref 6–20)
CO2: 21 mmol/L — ABNORMAL LOW (ref 22–32)
Calcium: 8.9 mg/dL (ref 8.9–10.3)
Chloride: 101 mmol/L (ref 101–111)
Creatinine, Ser: 1.21 mg/dL (ref 0.61–1.24)
Glucose, Bld: 132 mg/dL — ABNORMAL HIGH (ref 65–99)
POTASSIUM: 3.5 mmol/L (ref 3.5–5.1)
Sodium: 135 mmol/L (ref 135–145)
TOTAL PROTEIN: 8.5 g/dL — AB (ref 6.5–8.1)

## 2017-04-06 LAB — CBC
HCT: 44.2 % (ref 40.0–52.0)
Hemoglobin: 15.3 g/dL (ref 13.0–18.0)
MCH: 32.8 pg (ref 26.0–34.0)
MCHC: 34.5 g/dL (ref 32.0–36.0)
MCV: 95.1 fL (ref 80.0–100.0)
PLATELETS: 243 10*3/uL (ref 150–440)
RBC: 4.65 MIL/uL (ref 4.40–5.90)
RDW: 12.7 % (ref 11.5–14.5)
WBC: 7.1 10*3/uL (ref 3.8–10.6)

## 2017-04-06 LAB — URINALYSIS, COMPLETE (UACMP) WITH MICROSCOPIC
BILIRUBIN URINE: NEGATIVE
Glucose, UA: NEGATIVE mg/dL
HGB URINE DIPSTICK: NEGATIVE
Ketones, ur: 5 mg/dL — AB
LEUKOCYTES UA: NEGATIVE
NITRITE: NEGATIVE
PH: 6 (ref 5.0–8.0)
Protein, ur: 100 mg/dL — AB
SPECIFIC GRAVITY, URINE: 1.023 (ref 1.005–1.030)

## 2017-04-06 LAB — INFLUENZA PANEL BY PCR (TYPE A & B)
INFLAPCR: POSITIVE — AB
Influenza B By PCR: NEGATIVE

## 2017-04-06 LAB — LIPASE, BLOOD: LIPASE: 38 U/L (ref 11–51)

## 2017-04-06 LAB — PHENYTOIN LEVEL, TOTAL: Phenytoin Lvl: <2.5 ug/mL — ABNORMAL LOW (ref 10.0–20.0)
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
## 2017-04-06 MED ORDER — SODIUM CHLORIDE 0.9 % IV SOLN
INTRAVENOUS | Status: DC
  Administered 2017-04-06 – 2017-04-08 (×4): via INTRAVENOUS

## 2017-04-06 MED ORDER — OSELTAMIVIR PHOSPHATE 75 MG PO CAPS
75.0000 mg | ORAL_CAPSULE | Freq: Two times a day (BID) | ORAL | Status: DC
  Administered 2017-04-07 – 2017-04-08 (×3): 75 mg via ORAL
  Filled 2017-04-06 (×4): qty 1

## 2017-04-06 MED ORDER — AZITHROMYCIN 250 MG PO TABS
250.0000 mg | ORAL_TABLET | Freq: Every day | ORAL | Status: DC
  Administered 2017-04-07 – 2017-04-08 (×2): 250 mg via ORAL
  Filled 2017-04-06 (×2): qty 1

## 2017-04-06 MED ORDER — OSELTAMIVIR PHOSPHATE 75 MG PO CAPS
75.0000 mg | ORAL_CAPSULE | Freq: Once | ORAL | Status: AC
  Administered 2017-04-06: 75 mg via ORAL
  Filled 2017-04-06: qty 1

## 2017-04-06 MED ORDER — SODIUM CHLORIDE 0.9 % IV SOLN
500.0000 mg | Freq: Once | INTRAVENOUS | Status: AC
  Administered 2017-04-06: 500 mg via INTRAVENOUS
  Filled 2017-04-06 (×2): qty 500

## 2017-04-06 MED ORDER — SODIUM CHLORIDE 0.9 % IV SOLN
1.0000 g | Freq: Once | INTRAVENOUS | Status: AC
  Administered 2017-04-06: 1 g via INTRAVENOUS
  Filled 2017-04-06: qty 10

## 2017-04-06 MED ORDER — SODIUM CHLORIDE 0.9 % IV BOLUS (SEPSIS)
1000.0000 mL | Freq: Once | INTRAVENOUS | Status: AC
  Administered 2017-04-06: 1000 mL via INTRAVENOUS

## 2017-04-06 MED ORDER — ONDANSETRON HCL 4 MG/2ML IJ SOLN
4.0000 mg | Freq: Once | INTRAMUSCULAR | Status: AC
  Administered 2017-04-06: 4 mg via INTRAVENOUS
  Filled 2017-04-06: qty 2

## 2017-04-06 MED ORDER — OSELTAMIVIR PHOSPHATE 75 MG PO CAPS: 75.0000 mg | ORAL_CAPSULE | Freq: Two times a day (BID) | ORAL | 0 refills | Status: DC

## 2017-04-06 MED ORDER — ACETAMINOPHEN 500 MG PO TABS
1000.0000 mg | ORAL_TABLET | Freq: Once | ORAL | Status: AC
  Administered 2017-04-06: 1000 mg via ORAL
  Filled 2017-04-06: qty 2

## 2017-04-06 MED ORDER — PROMETHAZINE HCL 25 MG RE SUPP: 25.0000 mg | Freq: Four times a day (QID) | RECTAL | 0 refills | Status: DC | PRN

## 2017-04-06 MED ORDER — PROMETHAZINE HCL 25 MG/ML IJ SOLN
25.0000 mg | Freq: Once | INTRAMUSCULAR | Status: AC
  Administered 2017-04-06: 25 mg via INTRAVENOUS
  Filled 2017-04-06: qty 1

## 2017-04-06 MED ORDER — ACETAMINOPHEN 650 MG RE SUPP: 650.0000 mg | Freq: Four times a day (QID) | RECTAL | Status: DC | PRN

## 2017-04-06 MED ORDER — ACETAMINOPHEN 325 MG PO TABS
650.0000 mg | ORAL_TABLET | Freq: Four times a day (QID) | ORAL | Status: DC | PRN
  Administered 2017-04-06: 650 mg via ORAL
  Filled 2017-04-06: qty 2

## 2017-04-06 MED ORDER — ENOXAPARIN SODIUM 40 MG/0.4ML ~~LOC~~ SOLN
40.0000 mg | SUBCUTANEOUS | Status: DC
  Administered 2017-04-06 – 2017-04-07 (×2): 40 mg via SUBCUTANEOUS
  Filled 2017-04-06 (×2): qty 0.4

## 2017-04-06 MED ORDER — SODIUM CHLORIDE 0.9 % IV SOLN
Freq: Once | INTRAVENOUS | Status: AC
  Administered 2017-04-06: 15:00:00 via INTRAVENOUS

## 2017-04-06 MED ORDER — AZITHROMYCIN 250 MG PO TABS: 250.0000 mg | ORAL_TABLET | Freq: Every day | ORAL | 0 refills | Status: DC

## 2017-04-06 MED ORDER — IBUPROFEN 400 MG PO TABS
600.0000 mg | ORAL_TABLET | Freq: Four times a day (QID) | ORAL | Status: DC | PRN
  Administered 2017-04-06 – 2017-04-08 (×4): 600 mg via ORAL
  Filled 2017-04-06 (×4): qty 2

## 2017-04-06 MED ORDER — INFLUENZA VAC SPLIT QUAD 0.5 ML IM SUSY
0.5000 mL | PREFILLED_SYRINGE | INTRAMUSCULAR | Status: AC
  Administered 2017-04-08: 0.5 mL via INTRAMUSCULAR
  Filled 2017-04-06: qty 0.5

## 2017-04-06 MED ORDER — SODIUM CHLORIDE 0.9 % IV SOLN
1.0000 g | INTRAVENOUS | Status: DC
  Administered 2017-04-07: 1 g via INTRAVENOUS
  Filled 2017-04-06 (×2): qty 10

## 2017-04-06 MED ORDER — ONDANSETRON HCL 4 MG/2ML IJ SOLN
4.0000 mg | Freq: Four times a day (QID) | INTRAMUSCULAR | Status: DC | PRN
  Administered 2017-04-06: 4 mg via INTRAVENOUS
  Filled 2017-04-06: qty 2

## 2017-04-06 MED ORDER — PROMETHAZINE HCL 25 MG PO TABS: 25.0000 mg | ORAL_TABLET | Freq: Four times a day (QID) | ORAL | 0 refills | Status: DC | PRN

## 2017-04-06 MED ORDER — ONDANSETRON HCL 4 MG PO TABS: 4.0000 mg | ORAL_TABLET | Freq: Four times a day (QID) | ORAL | Status: DC | PRN

## 2017-04-06 MED ORDER — ONDANSETRON 4 MG PO TBDP
4.0000 mg | ORAL_TABLET | Freq: Three times a day (TID) | ORAL | 0 refills | Status: DC | PRN
Start: 2017-04-06 — End: 2019-04-30

## 2017-04-06 MED ORDER — LEVOFLOXACIN 750 MG PO TABS: 750.0000 mg | ORAL_TABLET | Freq: Every day | ORAL | 0 refills | Status: AC

## 2017-04-06 NOTE — ED Notes (Signed)
Pt ambulatory to toilet independently. 

## 2017-04-06 NOTE — Discharge Instructions (Signed)
Please take a clear liquid diet for the next 24 hours, then advance to a bland diet as tolerated.  You may use Zofran for nausea or vomiting.  Please take the entire course of antibiotics and Tamiflu, even if you are feeling better.  Make sure that you are washing her hands frequently and thoroughly to prevent the spread of infection.  You may continue to use Tylenol or Motrin for fever or body aches.  Please avoid close contact with any infants or children, elderly patients, or anyone you know whose immune system may be weak.  Return to the emergency department if you develop severe pain, lightheadedness or fainting, inability to keep down fluids, or any other symptoms concerning to you.

## 2017-04-06 NOTE — ED Notes (Signed)
Upon reassessment, pt continues to dry heave.  EDP notified.

## 2017-04-06 NOTE — ED Triage Notes (Signed)
Pt reports that last week he had strep. This week he developed a fever, chills, bodyaches.Pt is activley vomiting during triage.
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
## 2017-04-06 NOTE — ED Notes (Addendum)
Hospitalist to bedside at this time 
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
## 2017-04-06 NOTE — ED Notes (Signed)
First nurse note  Presents with n/v for 1 week also having some discomfort in chest with inspiration

## 2017-04-07 ENCOUNTER — Other Ambulatory Visit: Payer: Self-pay

## 2017-04-07 DIAGNOSIS — J189 Pneumonia, unspecified organism: Secondary | ICD-10-CM | POA: Diagnosis present

## 2017-04-07 NOTE — Progress Notes (Signed)
[facility] - [place] at [facility]   PATIENT NAME: Nicholas Reese    MR#:  [id number]  DATE OF BIRTH:  1980/05/05  SUBJECTIVE:  CHIEF COMPLAINT:   Chief Complaint  Patient presents with  . Nausea  . Emesis  . Fever  not feeling well at all, looking sick REVIEW OF SYSTEMS:  Review of Systems  Constitutional: Positive for fever and malaise/fatigue. Negative for chills and weight loss.  HENT: Negative for nosebleeds and sore throat.   Eyes: Negative for blurred vision.  Respiratory: Negative for cough, shortness of breath and wheezing.   Cardiovascular: Negative for chest pain, orthopnea, leg swelling and PND.  Gastrointestinal: Positive for nausea. Negative for abdominal pain, constipation, diarrhea, heartburn and vomiting.  Genitourinary: Negative for dysuria and urgency.  Musculoskeletal: Negative for back pain.  Skin: Negative for rash.  Neurological: Positive for weakness. Negative for dizziness, speech change, focal weakness and headaches.  Endo/Heme/Allergies: Does not bruise/bleed easily.  Psychiatric/Behavioral: Negative for depression.    DRUG ALLERGIES:   Allergies  Allergen Reactions  . Percocet [Oxycodone-Acetaminophen] Other (See Comments)    dizzy   VITALS:  Blood pressure 109/71, pulse 89, temperature 99.8 F (37.7 C), temperature source Oral, resp. rate 20, height 6' (1.829 m), weight 88.5 kg (195 lb), SpO2 97 %. PHYSICAL EXAMINATION:  Physical Exam  Constitutional: He is oriented to person, place, and time and well-developed, well-nourished, and in no distress.  HENT:  Head: Normocephalic and atraumatic.  Eyes: Conjunctivae and EOM are normal. Pupils are equal, round, and reactive to light.  Neck: Normal range of motion. Neck supple. No tracheal deviation present. No thyromegaly present.  Cardiovascular: Normal rate, regular rhythm and normal heart sounds.  Pulmonary/Chest: Effort normal and breath sounds normal. No respiratory  distress. He has no wheezes. He exhibits no tenderness.  Abdominal: Soft. Bowel sounds are normal. He exhibits no distension. There is no tenderness.  Musculoskeletal: Normal range of motion.  Neurological: He is alert and oriented to person, place, and time. No cranial nerve deficit.  Skin: Skin is warm and dry. No rash noted.  Psychiatric: Mood and affect normal.   LABORATORY PANEL:  Male CBC Recent Labs  Lab 04/06/17 1408  WBC 7.1  HGB 15.3  HCT 44.2  PLT 243   ------------------------------------------------------------------------------------------------------------------ Chemistries  Recent Labs  Lab 04/06/17 1408  NA 135  K 3.5  CL 101  CO2 21*  GLUCOSE 132*  BUN 14  CREATININE 1.21  CALCIUM 8.9  AST 32  ALT 20  ALKPHOS 64  BILITOT 0.5   RADIOLOGY:  No results found. ASSESSMENT AND PLAN:  37 year old male with past medical history of anxiety/depression, seizures or presents to the hospital due to fever, chills and nausea and vomiting.  1. Flu-patient is positive for influenza A.This is the cause of patient's fever, chills, rigors and malaise.  - airborne precautions, continue Tamiflu and supportive care with IV fluids,  - Tessalon Perles for the cough.  2. Intractable nausea and vomiting-likely related to the flu. -when necessary antiemetics, aggressively hydrate the patient with IV fluids, follow clinically.  3. Pneumonia-patient had a chest x-ray which showed a left infrahilar infiltrate. This could be superimposed pneumonia after the flu. - continue IV ceftriaxone, Zithromax. Follow cultures.        All the records are reviewed and case discussed with Care Management/Social Worker. Management plans discussed with the patient, nursing and they are in agreement.  CODE STATUS: Full Code  TOTAL TIME TAKING CARE  OF THIS PATIENT: 25 minutes.   More than 50% of the time was spent in counseling/coordination of care: YES  POSSIBLE D/C IN 1-2 DAYS,  DEPENDING ON CLINICAL CONDITION.   Delfino Lovett M.D on 04/07/2017 at 6:54 PM  Between 7am to 6pm - Pager - [phone]  After 6pm go to [email or web address] - Social research officer, government  [facility] [place] [facility]  Office  [phone]  CC: Primary care physician; Patient, No Pcp Per  Note: This dictation was prepared with Dragon dictation along with smaller phrase technology. Any transcriptional errors that result from this process are unintentional.

## 2017-04-08 LAB — URINE CULTURE

## 2017-04-08 LAB — HIV ANTIBODY (ROUTINE TESTING W REFLEX): HIV SCREEN 4TH GENERATION: NONREACTIVE

## 2017-04-08 MED ORDER — AZITHROMYCIN 250 MG PO TABS: ORAL_TABLET | ORAL | 0 refills | Status: DC

## 2017-04-08 MED ORDER — OSELTAMIVIR PHOSPHATE 75 MG PO CAPS: 75.0000 mg | ORAL_CAPSULE | Freq: Two times a day (BID) | ORAL | 0 refills | Status: DC

## 2017-04-08 NOTE — Progress Notes (Signed)
Advance diet to regular diet.  If he tolerates his breakfast without nausea or vomiting patient can be discharged home.  He agreed for this plan.  He needs to continue 5 more doses of Tamiflu, Levaquin for pneumonia.  Patient has no fever.  No nausea or vomiting since yesterday afternoon. Discharge instructions are in the computer.  Advised to have a PCP. Time spent 30 minutes

## 2017-04-08 NOTE — Care Management Note (Signed)
Case Management Note  Patient Details  Name: Nicholas Reese MRN: [id number] Date of Birth: 09-30-1980  Subjective/Objective:      Provided a  Medication assistance  MATCH coupon to uninsured [name].              Action/Plan:   Expected Discharge Date:  04/08/17               Expected Discharge Plan:     In-House Referral:     Discharge planning Services     Post Acute Care Choice:    Choice offered to:     DME Arranged:    DME Agency:     HH Arranged:    HH Agency:     Status of Service:     If discussed at [employer] Meetings, dates discussed:    Additional Comments:  Rayven Rettig A, RN 04/08/2017, 4:24 PM

## 2017-04-12 LAB — CULTURE, BLOOD (ROUTINE X 2)
CULTURE: NO GROWTH
Culture: NO GROWTH
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
## 2018-04-20 ENCOUNTER — Encounter: Payer: Self-pay | Admitting: Emergency Medicine

## 2018-04-20 ENCOUNTER — Emergency Department
Admission: EM | Admit: 2018-04-20 | Discharge: 2018-04-20 | Disposition: A | Discharge status: Home / Self Care | Payer: Self-pay | Attending: Emergency Medicine | Admitting: Emergency Medicine

## 2018-04-20 ENCOUNTER — Telehealth: Payer: Self-pay | Admitting: Emergency Medicine

## 2018-04-20 DIAGNOSIS — Z5321 Procedure and treatment not carried out due to patient leaving prior to being seen by health care provider: Secondary | ICD-10-CM | POA: Insufficient documentation

## 2018-04-20 DIAGNOSIS — R109 Unspecified abdominal pain: Secondary | ICD-10-CM | POA: Insufficient documentation

## 2018-04-20 LAB — CBC
HCT: 44.5 % (ref 39.0–52.0)
Hemoglobin: 14.7 g/dL (ref 13.0–17.0)
MCH: 32.7 pg (ref 26.0–34.0)
MCHC: 33 g/dL (ref 30.0–36.0)
MCV: 99.1 fL (ref 80.0–100.0)
PLATELETS: 227 10*3/uL (ref 150–400)
RBC: 4.49 MIL/uL (ref 4.22–5.81)
RDW: 12.9 % (ref 11.5–15.5)
WBC: 6.7 10*3/uL (ref 4.0–10.5)
nRBC: 0 % (ref 0.0–0.2)

## 2018-04-20 LAB — COMPREHENSIVE METABOLIC PANEL
ALBUMIN: 4.6 g/dL (ref 3.5–5.0)
ALK PHOS: 49 U/L (ref 38–126)
ALT: 15 U/L (ref 0–44)
ANION GAP: 10 (ref 5–15)
AST: 20 U/L (ref 15–41)
BUN: 12 mg/dL (ref 6–20)
CALCIUM: 9 mg/dL (ref 8.9–10.3)
CO2: 20 mmol/L — AB (ref 22–32)
Chloride: 106 mmol/L (ref 98–111)
Creatinine, Ser: 0.98 mg/dL (ref 0.61–1.24)
GFR calc non Af Amer: >60 mL/min (ref >60)
GLUCOSE: 94 mg/dL (ref 70–99)
Potassium: 3.7 mmol/L (ref 3.5–5.1)
SODIUM: 136 mmol/L (ref 135–145)
TOTAL PROTEIN: 7.4 g/dL (ref 6.5–8.1)
Total Bilirubin: 1.3 mg/dL — ABNORMAL HIGH (ref 0.3–1.2)

## 2018-04-20 LAB — LIPASE, BLOOD: Lipase: 23 U/L (ref 11–51)

## 2018-04-20 MED ORDER — SODIUM CHLORIDE 0.9% FLUSH: 3.0000 mL | Freq: Once | INTRAVENOUS | Status: DC

## 2018-04-20 NOTE — ED Notes (Signed)
Called to be roomed at 1230. Called at 1235.  Called at 1240 with no response.
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
## 2018-04-20 NOTE — ED Triage Notes (Signed)
C/O abdominal pain and vomiting x 4 days.  States pain worse last night.  Also c/o several "small" seizures over the past couple of days.  Has history of seizures, takes Keppra.   States seizures are typically controlled with medication, but since he has been vomiting it has been difficult to keep medications down.  AAOx3.  Skin warm and dry. NAD

## 2018-08-13 ENCOUNTER — Emergency Department: Payer: Self-pay

## 2018-08-13 ENCOUNTER — Encounter: Payer: Self-pay | Admitting: Emergency Medicine

## 2018-08-13 ENCOUNTER — Other Ambulatory Visit: Payer: Self-pay

## 2018-08-13 ENCOUNTER — Emergency Department
Admission: EM | Admit: 2018-08-13 | Discharge: 2018-08-13 | Disposition: A | Discharge status: Home / Self Care | Payer: Self-pay | Attending: Emergency Medicine | Admitting: Emergency Medicine

## 2018-08-13 DIAGNOSIS — R569 Unspecified convulsions: Secondary | ICD-10-CM | POA: Insufficient documentation

## 2018-08-13 DIAGNOSIS — Z87891 Personal history of nicotine dependence: Secondary | ICD-10-CM | POA: Insufficient documentation

## 2018-08-13 LAB — COMPREHENSIVE METABOLIC PANEL
ALT: 16 U/L (ref 0–44)
AST: 24 U/L (ref 15–41)
Albumin: 4.6 g/dL (ref 3.5–5.0)
Alkaline Phosphatase: 52 U/L (ref 38–126)
Anion gap: 10 (ref 5–15)
BUN: 12 mg/dL (ref 6–20)
CO2: 20 mmol/L — ABNORMAL LOW (ref 22–32)
Calcium: 8.9 mg/dL (ref 8.9–10.3)
Chloride: 107 mmol/L (ref 98–111)
Creatinine, Ser: 0.78 mg/dL (ref 0.61–1.24)
GFR calc Af Amer: >60 mL/min (ref >60)
GFR calc non Af Amer: >60 mL/min (ref >60)
Glucose, Bld: 121 mg/dL — ABNORMAL HIGH (ref 70–99)
Potassium: 3.9 mmol/L (ref 3.5–5.1)
Sodium: 137 mmol/L (ref 135–145)
Total Bilirubin: 0.7 mg/dL (ref 0.3–1.2)
Total Protein: 7.5 g/dL (ref 6.5–8.1)

## 2018-08-13 LAB — CBC WITH DIFFERENTIAL/PLATELET
Abs Immature Granulocytes: 0.03 10*3/uL (ref 0.00–0.07)
Basophils Absolute: 0 10*3/uL (ref 0.0–0.1)
Basophils Relative: 0 %
Eosinophils Absolute: 0 10*3/uL (ref 0.0–0.5)
Eosinophils Relative: 0 %
HCT: 41.5 % (ref 39.0–52.0)
Hemoglobin: 14.5 g/dL (ref 13.0–17.0)
Immature Granulocytes: 0 %
Lymphocytes Relative: 13 %
Lymphs Abs: 1.1 10*3/uL (ref 0.7–4.0)
MCH: 34 pg (ref 26.0–34.0)
MCHC: 34.9 g/dL (ref 30.0–36.0)
MCV: 97.2 fL (ref 80.0–100.0)
Monocytes Absolute: 0.4 10*3/uL (ref 0.1–1.0)
Monocytes Relative: 4 %
Neutro Abs: 7.5 10*3/uL (ref 1.7–7.7)
Neutrophils Relative %: 83 %
Platelets: 267 10*3/uL (ref 150–400)
RBC: 4.27 MIL/uL (ref 4.22–5.81)
RDW: 12.3 % (ref 11.5–15.5)
WBC: 9.1 10*3/uL (ref 4.0–10.5)
nRBC: 0 % (ref 0.0–0.2)

## 2018-08-13 LAB — TROPONIN I: Troponin I: <0.03 ng/mL (ref <0.03)

## 2018-08-13 LAB — MAGNESIUM: Magnesium: 1.9 mg/dL (ref 1.7–2.4)
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
## 2018-08-13 IMAGING — CT CT HEAD WITHOUT CONTRAST
5 of 7 series · 17 of 47 positions shown, 18 images · non-contrast
Comparison: CT scan of [DATE].

CLINICAL DATA: Altered level of consciousness.

EXAM:
CT HEAD WITHOUT CONTRAST
CT CERVICAL SPINE WITHOUT CONTRAST
TECHNIQUE: Multidetector CT imaging of the head and cervical spine was
performed following the standard protocol without intravenous
contrast. Multiplanar CT image reconstructions of the cervical spine
were also generated.

[Series 2: head wo · axial · 0.45mm/px · z∈[+619,+669]mm · 2 of 32 slices shown, 3 images]
[im 11/32  brain]
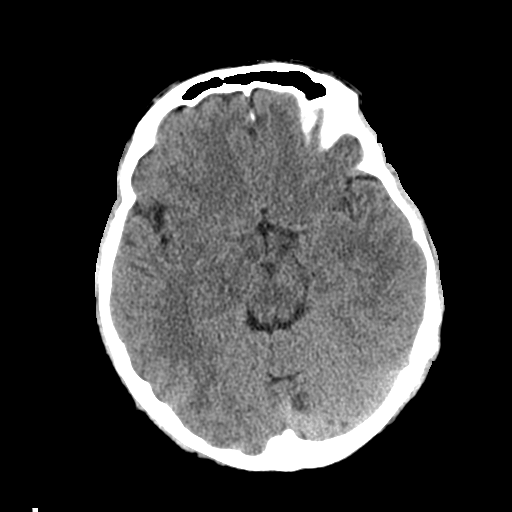
[im 11/32  bone]
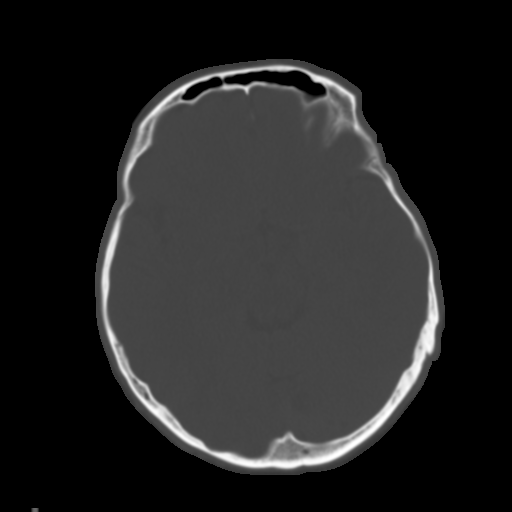
[im 21/32  brain]
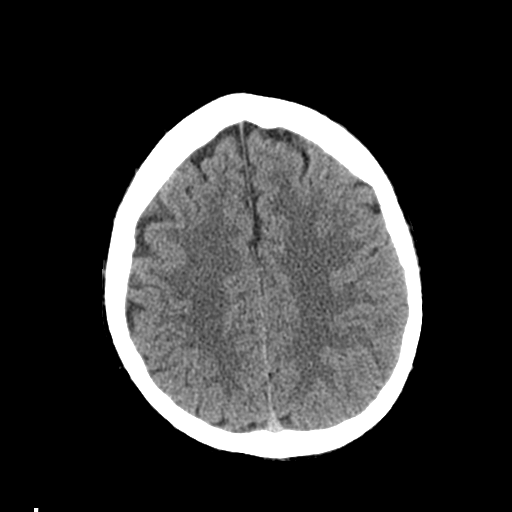

[Series 4: coronal soft tissue · coronal · 0.31mm/px · 3 of 61 slices shown]
[im 21/61  brain]
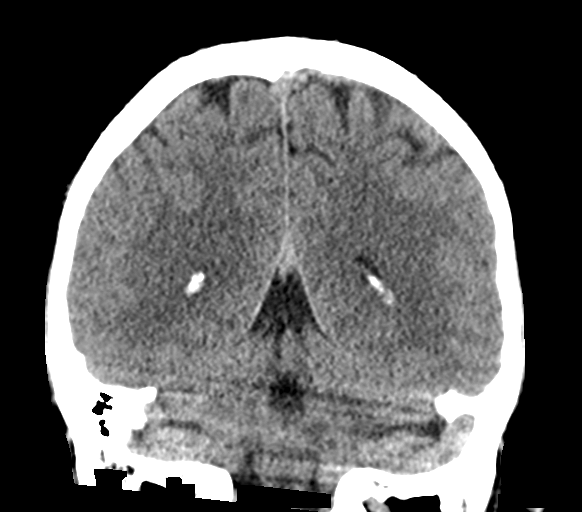
[im 31/61  brain]
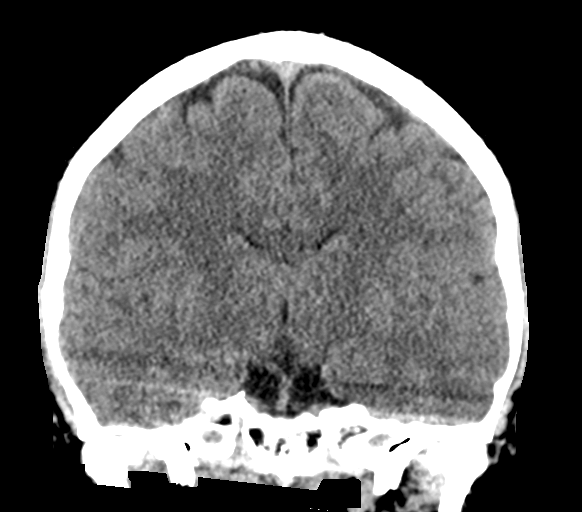
[im 41/61  brain]
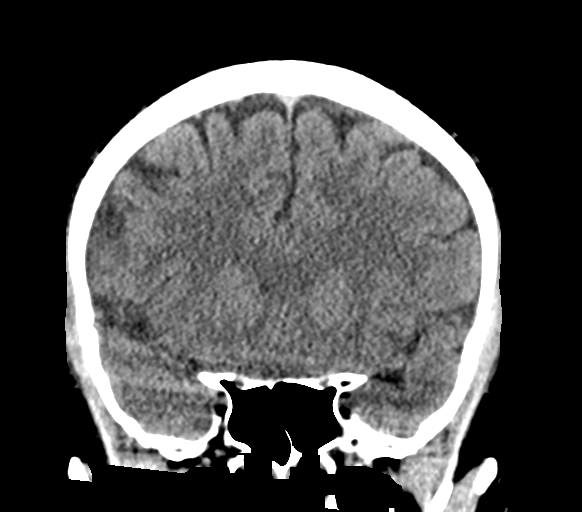

[Series 5: sagittal soft tissue · sagittal · 0.31mm/px · 1 of 55 slices shown]
[im 28/55  brain]
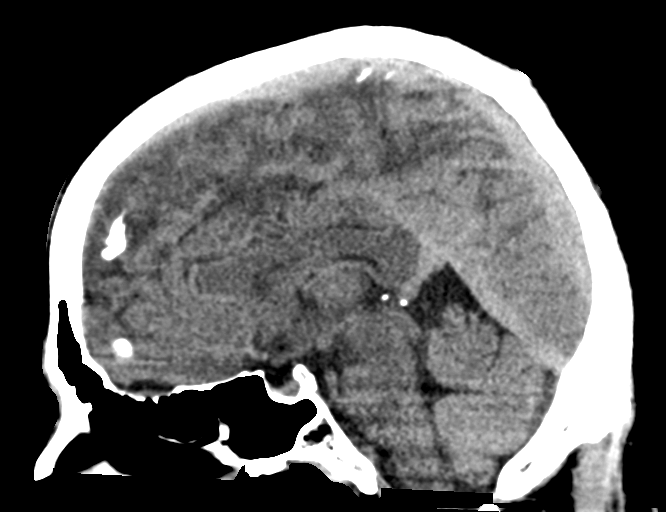

[Series 7: c spine soft · axial · 0.34mm/px · z∈[+424,+456]mm · 3 of 84 slices shown]
[im 9/84  brain]
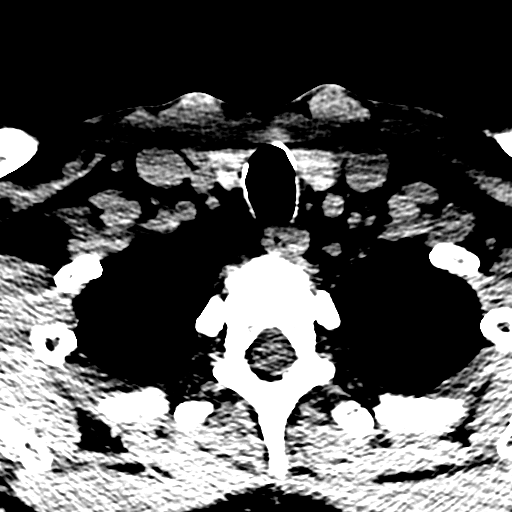
[im 17/84  brain]
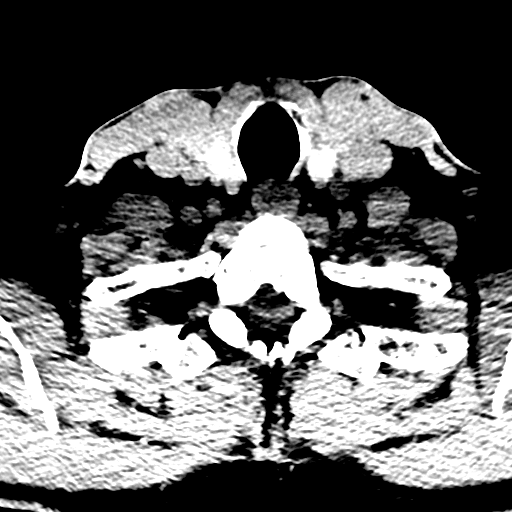
[im 25/84  brain]
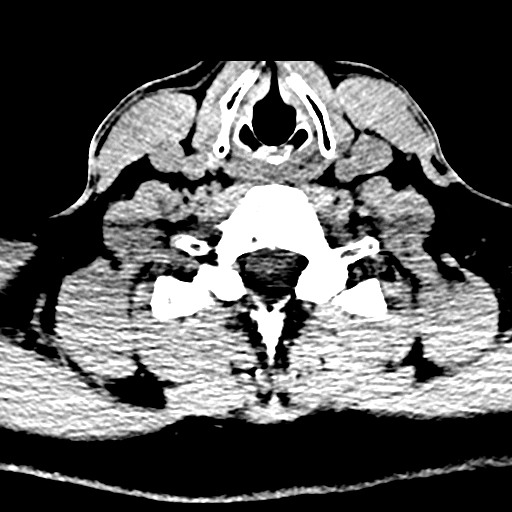

[Series 12: orthogonal bone · axial · 0.24mm/px · z∈[+366,+534]mm · 8 of 115 slices shown]
[im 9/115  bone]
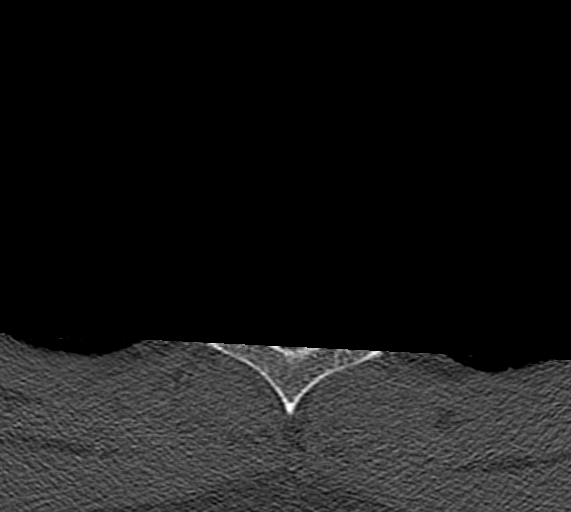
[im 25/115  bone]
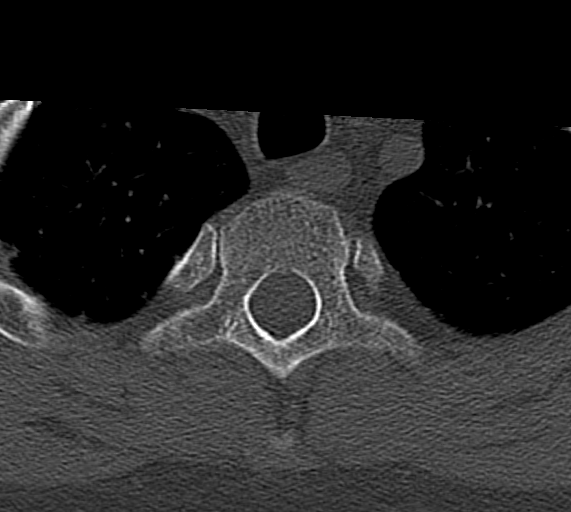
[im 41/115  bone]
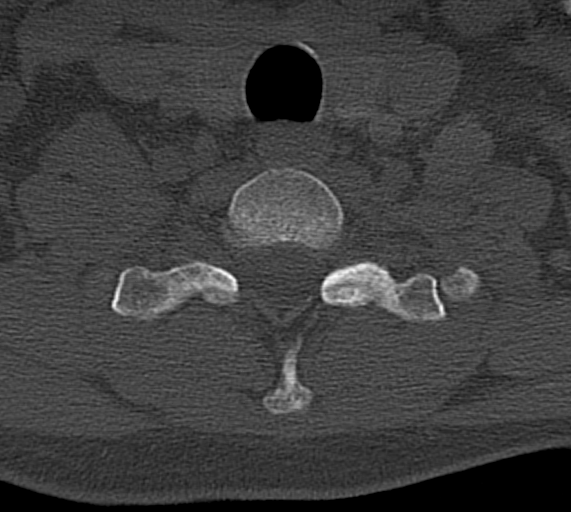
[im 49/115  bone]
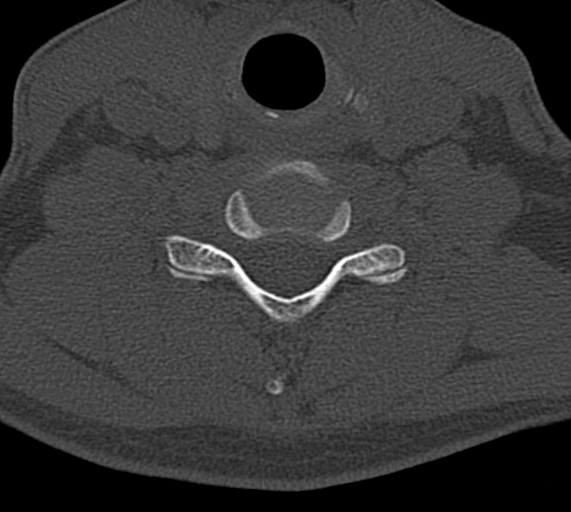
[im 66/115  bone]
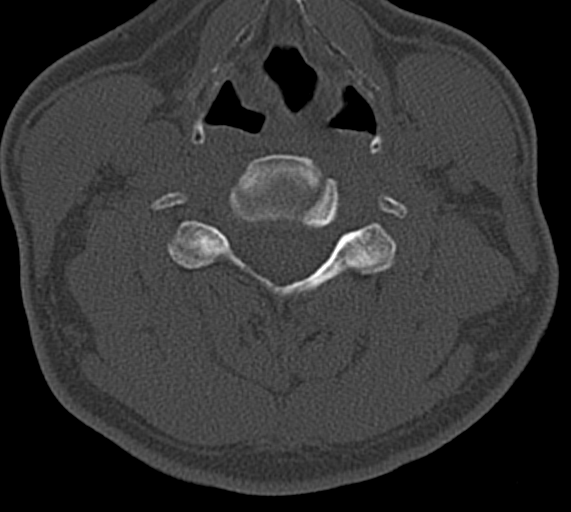
[im 74/115  bone]
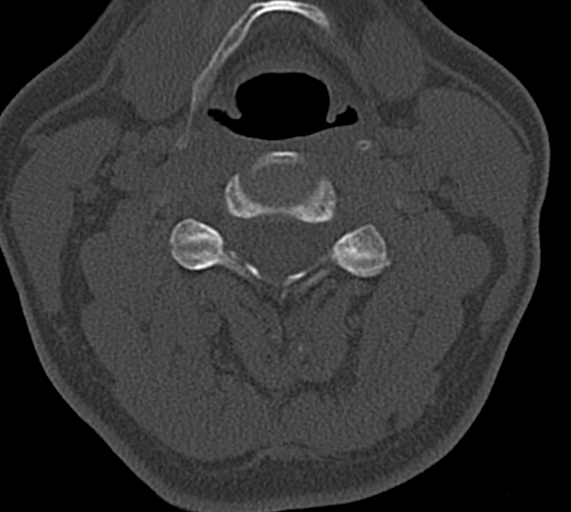
[im 90/115  bone]
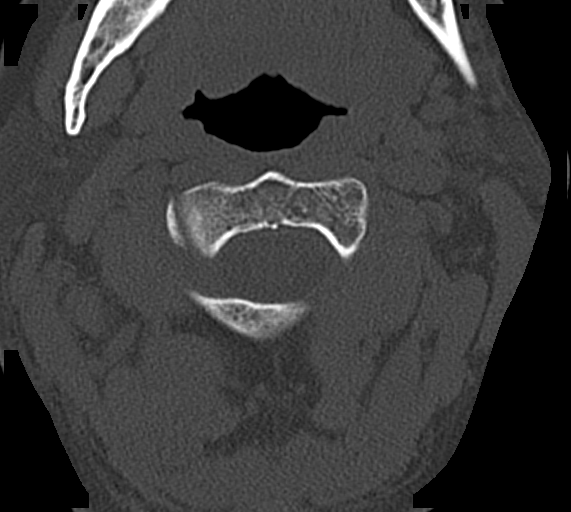
[im 106/115  bone]
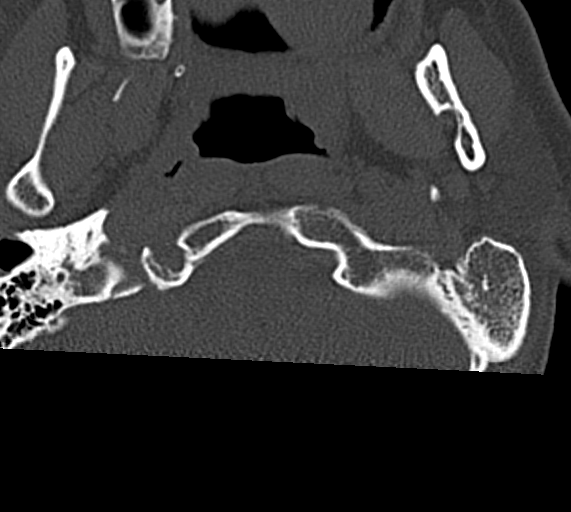

[17 of 47 positions shown; findings below may reference images not displayed]

FINDINGS: CT HEAD FINDINGS

Brain: No evidence of acute infarction, hemorrhage, hydrocephalus,
extra-axial collection or mass lesion/mass effect.

Vascular: No hyperdense vessel or unexpected calcification.

Skull: Normal. Negative for fracture or focal lesion.

Sinuses/Orbits: No acute finding.

Other: None.

CT CERVICAL SPINE FINDINGS

Alignment: Normal.

Skull base and vertebrae: No acute fracture. No primary bone lesion
or focal pathologic process.

Soft tissues and spinal canal: No prevertebral fluid or swelling. No
visible canal hematoma.

Disc levels: Mild degenerative disc disease is noted at C5-6 with
anterior osteophyte formation.

Upper chest: Negative.

Other: None.
IMPRESSION: Normal head CT.

Mild degenerative disc disease is noted at C5-6. No acute
abnormality seen in the cervical spine.

## 2018-08-13 MED ORDER — ONDANSETRON HCL 4 MG/2ML IJ SOLN
INTRAMUSCULAR | Status: AC
  Administered 2018-08-13: 4 mg via INTRAVENOUS
  Filled 2018-08-13: qty 2

## 2018-08-13 MED ORDER — LEVETIRACETAM 500 MG PO TABS: 500.0000 mg | ORAL_TABLET | Freq: Two times a day (BID) | ORAL | 2 refills | Status: DC

## 2018-08-13 MED ORDER — LEVETIRACETAM IN NACL 1000 MG/100ML IV SOLN
1000.0000 mg | Freq: Once | INTRAVENOUS | Status: AC
  Administered 2018-08-13: 17:00:00 1000 mg via INTRAVENOUS
  Filled 2018-08-13: qty 100

## 2018-08-13 MED ORDER — LEVETIRACETAM 500 MG PO TABS: 500.0000 mg | ORAL_TABLET | Freq: Two times a day (BID) | ORAL | 0 refills | Status: DC

## 2018-08-13 MED ORDER — ACETAMINOPHEN 500 MG PO TABS
1000.0000 mg | ORAL_TABLET | Freq: Once | ORAL | Status: DC
  Filled 2018-08-13: qty 2

## 2018-08-13 MED ORDER — KETOROLAC TROMETHAMINE 30 MG/ML IJ SOLN
15.0000 mg | Freq: Once | INTRAMUSCULAR | Status: AC
  Administered 2018-08-13: 17:00:00 15 mg via INTRAVENOUS
  Filled 2018-08-13: qty 1

## 2018-08-13 MED ORDER — ONDANSETRON HCL 4 MG/2ML IJ SOLN
4.0000 mg | Freq: Once | INTRAMUSCULAR | Status: AC
  Administered 2018-08-13: 4 mg via INTRAVENOUS

## 2018-08-13 MED ORDER — LORAZEPAM 2 MG/ML IJ SOLN
1.0000 mg | Freq: Once | INTRAMUSCULAR | Status: AC
  Administered 2018-08-13: 1 mg via INTRAVENOUS
  Filled 2018-08-13: qty 1

## 2018-08-13 NOTE — ED Provider Notes (Signed)
Clinical Course as of Aug 12 2036  Mon Aug 13, 2018  1744 Work-up is unremarkable, vital signs remain normal.  Patient is currently still very somnolent, will continue observing in the ED.   [PS]    Clinical Course User Index [PS] Carrie Mew, MD     ----------------------------------------- 8:38 PM on 08/13/2018 -----------------------------------------   Patient now awake and alert, conversant, ambulatory, back to baseline.  He is in good spirits.  Vital signs remain normal.  He again affirms that he has been out of his Keppra 500 twice daily that he used to take because he does not have a primary care doctor.  He is motivated to take the medicine now because he has newborn children that he needs to take care of.  I will provide him a prescription for the Keppra, encouraged him to find a primary care doctor and call the [facility] PCP finding hotline if needed.  He understands about seizure precautions including swimming scaffolding driving or other potentially dangerous activities due to his recent episode today.   Carrie Mew, MD 08/13/18 2039
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
## 2018-08-13 NOTE — ED Notes (Signed)
Patient transported to CT 

## 2018-08-13 NOTE — ED Notes (Signed)
Spoke with patient's fiance and reviewed discharge instructions and follow-up care.

## 2018-08-13 NOTE — ED Notes (Signed)
Patient ambulatory to use restroom.  Reports improvement of headache. States continued pain in chest. Alert and oriented x4.

## 2019-04-29 ENCOUNTER — Emergency Department
Admission: EM | Admit: 2019-04-29 | Discharge: 2019-04-29 | Disposition: A | Discharge status: Other Acute Inpatient Hospital | Payer: Self-pay | Attending: Emergency Medicine | Admitting: Emergency Medicine

## 2019-04-29 ENCOUNTER — Encounter: Payer: Self-pay | Admitting: Psychiatry

## 2019-04-29 ENCOUNTER — Other Ambulatory Visit: Payer: Self-pay

## 2019-04-29 ENCOUNTER — Inpatient Hospital Stay
Admission: EM | Admit: 2019-04-29 | Discharge: 2019-05-02 | DRG: 885 | Disposition: A | Discharge status: Home / Self Care | Payer: Self-pay | Source: Intra-hospital | Attending: Psychiatry | Admitting: Psychiatry

## 2019-04-29 DIAGNOSIS — G40909 Epilepsy, unspecified, not intractable, without status epilepticus: Secondary | ICD-10-CM

## 2019-04-29 DIAGNOSIS — Z818 Family history of other mental and behavioral disorders: Secondary | ICD-10-CM

## 2019-04-29 DIAGNOSIS — T71162D Asphyxiation due to hanging, intentional self-harm, subsequent encounter: Secondary | ICD-10-CM

## 2019-04-29 DIAGNOSIS — Z79899 Other long term (current) drug therapy: Secondary | ICD-10-CM

## 2019-04-29 DIAGNOSIS — Z8249 Family history of ischemic heart disease and other diseases of the circulatory system: Secondary | ICD-10-CM

## 2019-04-29 DIAGNOSIS — R45851 Suicidal ideations: Secondary | ICD-10-CM | POA: Insufficient documentation

## 2019-04-29 DIAGNOSIS — Z87891 Personal history of nicotine dependence: Secondary | ICD-10-CM

## 2019-04-29 DIAGNOSIS — Z046 Encounter for general psychiatric examination, requested by authority: Secondary | ICD-10-CM | POA: Insufficient documentation

## 2019-04-29 DIAGNOSIS — F32A Depression, unspecified: Secondary | ICD-10-CM

## 2019-04-29 DIAGNOSIS — T1491XA Suicide attempt, initial encounter: Secondary | ICD-10-CM | POA: Insufficient documentation

## 2019-04-29 DIAGNOSIS — Z885 Allergy status to narcotic agent status: Secondary | ICD-10-CM

## 2019-04-29 DIAGNOSIS — F411 Generalized anxiety disorder: Secondary | ICD-10-CM | POA: Diagnosis present

## 2019-04-29 DIAGNOSIS — Y92810 Car as the place of occurrence of the external cause: Secondary | ICD-10-CM | POA: Insufficient documentation

## 2019-04-29 DIAGNOSIS — F332 Major depressive disorder, recurrent severe without psychotic features: Principal | ICD-10-CM | POA: Diagnosis present

## 2019-04-29 DIAGNOSIS — Z20822 Contact with and (suspected) exposure to covid-19: Secondary | ICD-10-CM | POA: Insufficient documentation

## 2019-04-29 DIAGNOSIS — M542 Cervicalgia: Secondary | ICD-10-CM | POA: Insufficient documentation

## 2019-04-29 DIAGNOSIS — Z825 Family history of asthma and other chronic lower respiratory diseases: Secondary | ICD-10-CM

## 2019-04-29 DIAGNOSIS — Y999 Unspecified external cause status: Secondary | ICD-10-CM | POA: Insufficient documentation

## 2019-04-29 DIAGNOSIS — F329 Major depressive disorder, single episode, unspecified: Secondary | ICD-10-CM

## 2019-04-29 DIAGNOSIS — Y9389 Activity, other specified: Secondary | ICD-10-CM | POA: Insufficient documentation

## 2019-04-29 DIAGNOSIS — Z23 Encounter for immunization: Secondary | ICD-10-CM

## 2019-04-29 DIAGNOSIS — Z634 Disappearance and death of family member: Secondary | ICD-10-CM | POA: Insufficient documentation

## 2019-04-29 DIAGNOSIS — X838XXA Intentional self-harm by other specified means, initial encounter: Secondary | ICD-10-CM | POA: Insufficient documentation

## 2019-04-29 LAB — URINE DRUG SCREEN, QUALITATIVE (ARMC ONLY)
Amphetamines, Ur Screen: NOT DETECTED
Barbiturates, Ur Screen: NOT DETECTED
Benzodiazepine, Ur Scrn: NOT DETECTED
Cannabinoid 50 Ng, Ur ~~LOC~~: NOT DETECTED
Cocaine Metabolite,Ur ~~LOC~~: NOT DETECTED
MDMA (Ecstasy)Ur Screen: NOT DETECTED
Methadone Scn, Ur: NOT DETECTED
Opiate, Ur Screen: NOT DETECTED
Phencyclidine (PCP) Ur S: NOT DETECTED
Tricyclic, Ur Screen: POSITIVE — AB

## 2019-04-29 LAB — COMPREHENSIVE METABOLIC PANEL
ALT: 16 U/L (ref 0–44)
AST: 20 U/L (ref 15–41)
Albumin: 5.1 g/dL — ABNORMAL HIGH (ref 3.5–5.0)
Alkaline Phosphatase: 66 U/L (ref 38–126)
Anion gap: 10 (ref 5–15)
BUN: 16 mg/dL (ref 6–20)
CO2: 23 mmol/L (ref 22–32)
Calcium: 9.5 mg/dL (ref 8.9–10.3)
Chloride: 102 mmol/L (ref 98–111)
Creatinine, Ser: 1.19 mg/dL (ref 0.61–1.24)
GFR calc Af Amer: >60 mL/min (ref >60)
GFR calc non Af Amer: >60 mL/min (ref >60)
Glucose, Bld: 98 mg/dL (ref 70–99)
Potassium: 4.3 mmol/L (ref 3.5–5.1)
Sodium: 135 mmol/L (ref 135–145)
Total Bilirubin: 0.8 mg/dL (ref 0.3–1.2)
Total Protein: 8.3 g/dL — ABNORMAL HIGH (ref 6.5–8.1)

## 2019-04-29 LAB — MAGNESIUM: Magnesium: 2.1 mg/dL (ref 1.7–2.4)

## 2019-04-29 LAB — CBC WITH DIFFERENTIAL/PLATELET
Abs Immature Granulocytes: 0.04 10*3/uL (ref 0.00–0.07)
Basophils Absolute: 0 10*3/uL (ref 0.0–0.1)
Basophils Relative: 1 %
Eosinophils Absolute: 0 10*3/uL (ref 0.0–0.5)
Eosinophils Relative: 0 %
HCT: 48.7 % (ref 39.0–52.0)
Hemoglobin: 16.3 g/dL (ref 13.0–17.0)
Immature Granulocytes: 1 %
Lymphocytes Relative: 14 %
Lymphs Abs: 1.1 10*3/uL (ref 0.7–4.0)
MCH: 33.1 pg (ref 26.0–34.0)
MCHC: 33.5 g/dL (ref 30.0–36.0)
MCV: 99 fL (ref 80.0–100.0)
Monocytes Absolute: 0.4 10*3/uL (ref 0.1–1.0)
Monocytes Relative: 5 %
Neutro Abs: 6.2 10*3/uL (ref 1.7–7.7)
Neutrophils Relative %: 79 %
Platelets: 308 10*3/uL (ref 150–400)
RBC: 4.92 MIL/uL (ref 4.22–5.81)
RDW: 12.3 % (ref 11.5–15.5)
WBC: 7.7 10*3/uL (ref 4.0–10.5)
nRBC: 0 % (ref 0.0–0.2)

## 2019-04-29 LAB — SALICYLATE LEVEL: Salicylate Lvl: <7 mg/dL — ABNORMAL LOW (ref 7.0–30.0)

## 2019-04-29 LAB — ACETAMINOPHEN LEVEL
Acetaminophen (Tylenol), Serum: 48 ug/mL — ABNORMAL HIGH (ref 10–30)
Acetaminophen (Tylenol), Serum: <10 ug/mL — ABNORMAL LOW (ref 10–30)

## 2019-04-29 LAB — RESPIRATORY PANEL BY RT PCR (FLU A&B, COVID)
Influenza A by PCR: NEGATIVE
Influenza B by PCR: NEGATIVE
SARS Coronavirus 2 by RT PCR: NEGATIVE

## 2019-04-29 LAB — ETHANOL: Alcohol, Ethyl (B): <10 mg/dL (ref <10)

## 2019-04-29 MED ORDER — INFLUENZA VAC SPLIT QUAD 0.5 ML IM SUSY
0.5000 mL | PREFILLED_SYRINGE | INTRAMUSCULAR | Status: AC
  Administered 2019-04-30: 0.5 mL via INTRAMUSCULAR
  Filled 2019-04-29: qty 0.5

## 2019-04-29 MED ORDER — TRAZODONE HCL 100 MG PO TABS: 100.0000 mg | ORAL_TABLET | Freq: Every evening | ORAL | Status: DC | PRN

## 2019-04-29 MED ORDER — ALUM & MAG HYDROXIDE-SIMETH 200-200-20 MG/5ML PO SUSP: 30.0000 mL | ORAL | Status: DC | PRN

## 2019-04-29 MED ORDER — PAROXETINE HCL 20 MG PO TABS
20.0000 mg | ORAL_TABLET | Freq: Every day | ORAL | Status: DC
  Administered 2019-04-29 – 2019-05-02 (×4): 20 mg via ORAL
  Filled 2019-04-29 (×5): qty 1

## 2019-04-29 MED ORDER — PAROXETINE HCL 20 MG PO TABS
20.0000 mg | ORAL_TABLET | Freq: Every day | ORAL | Status: DC
  Filled 2019-04-29: qty 1

## 2019-04-29 MED ORDER — LEVETIRACETAM 500 MG PO TABS
1000.0000 mg | ORAL_TABLET | Freq: Two times a day (BID) | ORAL | Status: DC
  Administered 2019-04-29 – 2019-05-02 (×6): 1000 mg via ORAL
  Filled 2019-04-29 (×8): qty 2

## 2019-04-29 MED ORDER — LEVETIRACETAM 500 MG PO TABS
1000.0000 mg | ORAL_TABLET | Freq: Two times a day (BID) | ORAL | Status: DC
  Administered 2019-04-29: 1000 mg via ORAL
  Filled 2019-04-29: qty 2

## 2019-04-29 MED ORDER — MAGNESIUM HYDROXIDE 400 MG/5ML PO SUSP: 30.0000 mL | Freq: Every day | ORAL | Status: DC | PRN

## 2019-04-29 MED ORDER — TRAZODONE HCL 100 MG PO TABS
100.0000 mg | ORAL_TABLET | Freq: Every evening | ORAL | Status: DC | PRN
  Administered 2019-04-30: 100 mg via ORAL
  Filled 2019-04-29: qty 1

## 2019-04-29 NOTE — [facility] Group Notes (Signed)
[facility] Group Notes:  (Nursing/MHT/Case Management/Adjunct)  Date:  04/29/2019  Time:  10:18 PM  Type of Therapy:  Group Therapy  Participation Level:  Active  Participation Quality:  Appropriate, Sharing and Supportive  Affect:  Appropriate  Cognitive:  Alert and Appropriate  Insight:  Appropriate and Good  Engagement in Group:  Engaged and Supportive  Modes of Intervention:  Education and Support  Summary of Progress/Problems: PT was very outgoing during group. He has goals of getting out of here and going down the right path and SEEK HELP instead of having thoughts of hurting himself  Landry Mellow 04/29/2019, 10:18 PM

## 2019-04-29 NOTE — ED Notes (Signed)
Pt belongings include one pair jeans, one tshirt, one pair underwear, two boots, two socks, one wallet, one phone. Phone turned off per pt request.

## 2019-04-29 NOTE — ED Notes (Signed)
Pt  Placed  Under  IVC  INFORMED  RN  JESSICA  C

## 2019-04-29 NOTE — Plan of Care (Signed)
Pt new to the unit today, hasn't had time to progress  Problem: Education: Goal: Knowledge of [place] [facility] Education information/materials will improve Outcome: Not Progressing Goal: Emotional status will improve Outcome: Not Progressing Goal: Mental status will improve Outcome: Not Progressing Goal: Verbalization of understanding the information provided will improve Outcome: Not Progressing   Problem: Health Behavior/Discharge Planning: Goal: Identification of resources available to assist in meeting health care needs will improve Outcome: Not Progressing Goal: Compliance with treatment plan for underlying cause of condition will improve Outcome: Not Progressing   Problem: Education: Goal: Utilization of techniques to improve thought processes will improve Outcome: Not Progressing Goal: Knowledge of the prescribed therapeutic regimen will improve Outcome: Not Progressing

## 2019-04-29 NOTE — ED Notes (Signed)
Pt taken off monitoring and IV removed per Dr. Cyril Loosen.

## 2019-04-29 NOTE — ED Notes (Signed)
Pt given meal tray.

## 2019-04-29 NOTE — Progress Notes (Signed)
Patient admitted for overdose of Tylenol and Benadryl and depression.Patient sad and tearful  but cooperative during admission assessment. Patient denies SI/HI at this time. Patient denies AVH. Patient informed of fall risk status, fall risk assessed "low" at this time. Patient oriented to unit/staff/room. Patient denies any questions/concerns at this time. Patient safe on unit with Q15 minute checks for safety.Skin assessment and body search done,no contraband found.

## 2019-04-29 NOTE — BH Assessment (Signed)
Patient is to be admitted to [facility] by Dr. Toni Amend.  Attending Physician will be Dr. Toni Amend.   Patient has been assigned to room 307, by [facility] Charge Nurse Gigi.   ER staff is aware of the admission:  Misty Stanley, ER Secretary    Dr. Larinda Buttery, ER MD  Maralyn Sago, Patient's Nurse  Lorene Dy, Patient Access.
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
## 2019-04-29 NOTE — Progress Notes (Signed)
Patient was in the day room upon arrival to the unit. Patient pleasant during assessment denying SI/HI/AVH with this Clinical research associate. Patient endorses anxiety and depression. Pt didn't have any medications scheduled this evening and didn't request anything PRN. Patient given education, support and encouragement to be active in his treatment plan. Patient being monitored Q 15 minutes for safety per unit protocol. Patient remains safe on the unit.

## 2019-04-29 NOTE — ED Notes (Signed)
Dr.Clapacs at bedside  

## 2019-04-29 NOTE — Tx Team (Signed)
Initial Treatment Plan 04/29/2019 6:13 PM [name] GEF:[id number]    PATIENT STRESSORS: Medication change or noncompliance Traumatic event   PATIENT STRENGTHS: Average or above average intelligence Communication skills Motivation for treatment/growth Supportive family/friends   PATIENT IDENTIFIED PROBLEMS: Depression  Overdose                   DISCHARGE CRITERIA:  Ability to meet basic life and health needs Adequate post-discharge living arrangements Motivation to continue treatment in a less acute level of care Verbal commitment to aftercare and medication compliance  PRELIMINARY DISCHARGE PLAN: Attend aftercare/continuing care group Return to previous living arrangement Return to previous work or school arrangements  PATIENT/FAMILY INVOLVEMENT: This treatment plan has been presented to and reviewed with the patient, Nicholas Reese, and/or family member,.  The patient and family have been given the opportunity to ask questions and make suggestions.  Leonarda Salon, RN 04/29/2019, 6:13 PM
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
## 2019-04-29 NOTE — ED Notes (Signed)
TTS at bedside. 
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
## 2019-04-30 DIAGNOSIS — F332 Major depressive disorder, recurrent severe without psychotic features: Principal | ICD-10-CM
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
## 2019-04-30 NOTE — Progress Notes (Signed)
Recreation Therapy Notes  INPATIENT RECREATION THERAPY ASSESSMENT  Patient Details Name: Nicholas Reese MRN: [id number] DOB: 1980-07-19 Today's Date: 04/30/2019       Information Obtained From: Patient  Able to Participate in Assessment/Interview: Yes  Patient Presentation: Responsive  Reason for Admission (Per Patient): Active Symptoms, Med Non-Compliance  Patient Stressors:    Coping Skills:   Talk, Art, Prayer  Leisure Interests (2+):  Art - Other (Comment)(Fire, Collage)  Frequency of Recreation/Participation: Monthly  Awareness of Community Resources:  Yes  Community Resources:  [employer]  Current Use:    If no, Barriers?:    Expressed Interest in [employer] Information:    [place] of Residence:  Film/video editor  Patient Main Form of Transportation: Set designer  Patient Strengths:  Art  Patient Identified Areas of Improvement:  Resources  Patient Goal for Hospitalization:  Stay on my medicine  Current SI (including self-harm):  No  Current HI:  No  Current AVH: No  Staff Intervention Plan: Group Attendance, Collaborate with Interdisciplinary Treatment Team  Consent to Intern Participation: N/A  Nicholas Reese 04/30/2019, 3:53 PM

## 2019-04-30 NOTE — Tx Team (Addendum)
Interdisciplinary Treatment and Diagnostic Plan Update  04/30/2019 Time of Session: 900am Nicholas Reese MRN: [id number]  Principal Diagnosis: <principal problem not specified>  Secondary Diagnoses: Active Problems:   Severe recurrent major depression without psychotic features ([place])   Current Medications:  Current Facility-Administered Medications  Medication Dose Route Frequency Provider Last Rate Last Admin  . alum & mag hydroxide-simeth (MAALOX/MYLANTA) 200-200-20 MG/5ML suspension 30 mL  30 mL Oral Q4H PRN Clapacs, John T, MD      . levETIRAcetam (KEPPRA) tablet 1,000 mg  1,000 mg Oral BID Clapacs, Madie Reno, MD   1,000 mg at 04/30/19 0630  . magnesium hydroxide (MILK OF MAGNESIA) suspension 30 mL  30 mL Oral Daily PRN Clapacs, John T, MD      . PARoxetine (PAXIL) tablet 20 mg  20 mg Oral Daily Clapacs, Madie Reno, MD   20 mg at 04/30/19 0950  . traZODone (DESYREL) tablet 100 mg  100 mg Oral QHS PRN Clapacs, Madie Reno, MD       PTA Medications: Medications Prior to Admission  Medication Sig Dispense Refill Last Dose  . azithromycin (ZITHROMAX) 250 MG tablet 5 days (Patient not taking: Reported on 04/29/2019) 5 each 0   . diphenhydramine-acetaminophen (TYLENOL PM) 25-500 MG TABS tablet Take 2 tablets by mouth at bedtime as needed (sleep).     Marland Kitchen levETIRAcetam (KEPPRA) 500 MG tablet Take 1 tablet (500 mg total) by mouth 2 (two) times daily for 30 days. (Patient taking differently: Take 1,000 mg by mouth 2 (two) times daily. Pt has been out for a few days) 60 tablet 2   . ondansetron (ZOFRAN ODT) 4 MG disintegrating tablet Take 1 tablet (4 mg total) by mouth every 8 (eight) hours as needed for nausea or vomiting. (Patient not taking: Reported on 04/29/2019) 20 tablet 0   . oseltamivir (TAMIFLU) 75 MG capsule Take 1 capsule (75 mg total) by mouth 2 (two) times daily. Take 5 more doses (Patient not taking: Reported on 04/29/2019) 5 capsule 0   . promethazine (PHENERGAN) 25 MG suppository Place 1  suppository (25 mg total) rectally every 6 (six) hours as needed for nausea or vomiting. (Patient not taking: Reported on 04/29/2019) 12 each 0   . promethazine (PHENERGAN) 25 MG tablet Take 1 tablet (25 mg total) by mouth every 6 (six) hours as needed for nausea or vomiting. (Patient not taking: Reported on 04/29/2019) 12 tablet 0     Patient Stressors: Medication change or noncompliance Traumatic event  Patient Strengths: Average or above average intelligence Communication skills Motivation for treatment/growth Supportive family/friends  Treatment Modalities: Medication Management, Group therapy, Case management,  1 to 1 session with clinician, Psychoeducation, Recreational therapy.   Physician Treatment Plan for Primary Diagnosis: <principal problem not specified> Long Term Goal(s):     Short Term Goals:    Medication Management: Evaluate patient's response, side effects, and tolerance of medication regimen.  Therapeutic Interventions: 1 to 1 sessions, Unit Group sessions and Medication administration.  Evaluation of Outcomes: Not Met  Physician Treatment Plan for Secondary Diagnosis: Active Problems:   Severe recurrent major depression without psychotic features ([place])  Long Term Goal(s):     Short Term Goals:       Medication Management: Evaluate patient's response, side effects, and tolerance of medication regimen.  Therapeutic Interventions: 1 to 1 sessions, Unit Group sessions and Medication administration.  Evaluation of Outcomes: Not Met   RN Treatment Plan for Primary Diagnosis: <principal problem not specified> Long Term Goal(s): Knowledge  of disease and therapeutic regimen to maintain health will improve  Short Term Goals: Ability to remain free from injury will improve, Ability to verbalize feelings will improve, Ability to identify and develop effective coping behaviors will improve and Compliance with prescribed medications will improve  Medication Management:  RN will administer medications as ordered by provider, will assess and evaluate patient's response and provide education to patient for prescribed medication. RN will report any adverse and/or side effects to prescribing provider.  Therapeutic Interventions: 1 on 1 counseling sessions, Psychoeducation, Medication administration, Evaluate responses to treatment, Monitor vital signs and CBGs as ordered, Perform/monitor CIWA, COWS, AIMS and Fall Risk screenings as ordered, Perform wound care treatments as ordered.  Evaluation of Outcomes: Not Met   LCSW Treatment Plan for Primary Diagnosis: <principal problem not specified> Long Term Goal(s): Safe transition to appropriate next level of care at discharge, Engage patient in therapeutic group addressing interpersonal concerns.  Short Term Goals: Engage patient in aftercare planning with referrals and resources, Increase social support and Increase skills for wellness and recovery  Therapeutic Interventions: Assess for all discharge needs, 1 to 1 time with Social worker, Explore available resources and support systems, Assess for adequacy in community support network, Educate family and significant other(s) on suicide prevention, Complete Psychosocial Assessment, Interpersonal group therapy.  Evaluation of Outcomes: Not Met   Progress in Treatment: Attending groups: Yes. Participating in groups: Yes. Taking medication as prescribed: Yes. Toleration medication: Yes. Family/Significant other contact made: No, will contact:  when consent given Patient understands diagnosis: Yes. Discussing patient identified problems/goals with staff: Yes. Medical problems stabilized or resolved: Yes. Denies suicidal/homicidal ideation: Yes. Issues/concerns per patient self-inventory: No. Other: N/A  New problem(s) identified: No, Describe:  none  New Short Term/Long Term Goal(s): Detox, elimination of AVH/symptoms of psychosis, medication management for mood  stabilization; elimination of SI thoughts; development of comprehensive mental wellness/sobriety plan.   Patient Goals:  "Getting set up with [facility] and staying on my medicine"  Discharge Plan or Barriers: SPE pamphlet, Mobile Crisis information, and AA/NA information provided to patient for additional community support and resources. Pt has an appointment with [facility] on 3/17  Reason for Continuation of Hospitalization: Depression Medication stabilization Suicidal ideation  Estimated Length of Stay: 3-5 days  Recreational Therapy: Patient Stressors: N/A Patient Goal: Patient will engage in groups without prompting or encouragement from LRT x3 group sessions within 5 recreation therapy group sessions  Attendees: Patient: Nicholas Reese 04/30/2019 1:42 PM  Physician: Dr Weber Cooks MD 04/30/2019 1:42 PM  Nursing: Lyda Kalata, RN 04/30/2019 1:42 PM  RN Care Manager: 04/30/2019 1:42 PM  Social Worker: Minette Brine Moton LCSW 04/30/2019 1:42 PM  Recreational Therapist: Roanna Epley CTRS LRT 04/30/2019 1:42 PM  Other:  04/30/2019 1:42 PM  Other:  04/30/2019 1:42 PM  Other: 04/30/2019 1:42 PM    Scribe for Treatment Team: Mariann Laster Moton, LCSW 04/30/2019 1:42 PM
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
## 2019-04-30 NOTE — [facility] Group Notes (Signed)
[facility] Group Notes:  (Nursing/MHT/Case Management/Adjunct)  Date:  04/30/2019  Time:  2:36 PM  Type of Therapy:  Psychoeducational Skills  Participation Level:  Active  Participation Quality:  Appropriate, Attentive and Sharing  Affect:  Appropriate  Cognitive:  Alert and Appropriate  Insight:  Appropriate  Engagement in Group:  Engaged  Modes of Intervention:  Activity, Discussion and Education  Summary of Progress/Problems:  Lynelle Smoke [facility] 04/30/2019, 2:36 PM

## 2019-04-30 NOTE — [facility] Suicide Risk Assessment (Signed)
[facility] Admission Suicide Risk Assessment   Nursing information obtained from:  Patient Demographic factors:  Male, Caucasian, Divorced or widowed Current Mental Status:  NA Loss Factors:  Loss of significant relationship Historical Factors:  Anniversary of important loss Risk Reduction Factors:  Living with another person, especially a relative, Employed, Responsible for children under 41 years of age, Positive social support  Total Time spent with patient: 1 hour Principal Problem: Severe recurrent major depression without psychotic features ([facility]) Diagnosis:  Principal Problem:   Severe recurrent major depression without psychotic features ([facility]) Active Problems:   Seizure disorder ([facility])  Subjective Data: Patient seen chart reviewed.  Patient with recurrent chronic depression and anxiety presented to the emergency room with thoughts of hanging himself.  On interview today he denies suicidal ideation and presents without psychosis and with good insight about treatment  Continued Clinical Symptoms:  Alcohol Use Disorder Identification Test Final Score (AUDIT): 1 The "Alcohol Use Disorders Identification Test", Guidelines for Use in Primary Care, Second Edition.  World Science writer [facility]). Score between 0-7:  no or low risk or alcohol related problems. Score between 8-15:  moderate risk of alcohol related problems. Score between 16-19:  high risk of alcohol related problems. Score 20 or above:  warrants further diagnostic evaluation for alcohol dependence and treatment.   CLINICAL FACTORS:   Severe Anxiety and/or Agitation Depression:   Insomnia   Musculoskeletal: Strength & Muscle Tone: within normal limits Gait & Station: normal Patient leans: N/A  Psychiatric Specialty Exam: Physical Exam  Nursing note and vitals reviewed. Constitutional: He appears well-developed and well-nourished.  HENT:  Head: Normocephalic and atraumatic.  Eyes: Pupils are equal, round, and  reactive to light. Conjunctivae are normal.  Cardiovascular: Regular rhythm and normal heart sounds.  Respiratory: Effort normal.  GI: Soft.  Musculoskeletal:        General: Normal range of motion.     Cervical back: Normal range of motion.  Neurological: He is alert.  Skin: Skin is warm and dry.  Psychiatric: Judgment normal. His mood appears anxious. His speech is delayed. He is slowed. Thought content is not paranoid. Cognition and memory are normal. He expresses no homicidal and no suicidal ideation.    Review of Systems  Constitutional: Negative.   HENT: Negative.   Eyes: Negative.   Respiratory: Negative.   Cardiovascular: Negative.   Gastrointestinal: Negative.   Musculoskeletal: Negative.   Skin: Negative.   Neurological: Negative.   Psychiatric/Behavioral: Positive for decreased concentration and dysphoric mood. The patient is nervous/anxious.     Blood pressure 129/79, pulse 96, temperature 97.7 F (36.5 C), temperature source Oral, resp. rate 17, height 6' (1.829 m), weight 74.4 kg, SpO2 100 %.Body mass index is 22.24 kg/m.  General Appearance: Disheveled  Eye Contact:  Good  Speech:  Clear and Coherent  Volume:  Normal  Mood:  Euthymic  Affect:  Congruent  Thought Process:  Goal Directed  Orientation:  Full (Time, Place, and Person)  Thought Content:  Logical  Suicidal Thoughts:  No  Homicidal Thoughts:  No  Memory:  Immediate;   Fair Recent;   Fair Remote;   Fair  Judgement:  Fair  Insight:  Fair  Psychomotor Activity:  Normal  Concentration:  Concentration: Fair  Recall:  [employer] of Knowledge:  Fair  Language:  Fair  Akathisia:  No  Handed:  Right  AIMS (if indicated):     Assets:  Desire for Improvement Housing Physical Health Social Support  ADL's:  Intact  Cognition:  WNL  Sleep:  Number of Hours: 8      COGNITIVE FEATURES THAT CONTRIBUTE TO RISK:  None    SUICIDE RISK:   Mild:  Suicidal ideation of limited frequency, intensity,  duration, and specificity.  There are no identifiable plans, no associated intent, mild dysphoria and related symptoms, good self-control (both objective and subjective assessment), few other risk factors, and identifiable protective factors, including available and accessible social support.  PLAN OF CARE: Continue antidepressant medicine.  Engage in individual and group therapy.  Continue 15-minute checks.  Reassess dangerousness prior to discharge  I certify that inpatient services furnished can reasonably be expected to improve the patient's condition.   Alethia Berthold, MD 04/30/2019, 3:32 PM

## 2019-04-30 NOTE — Progress Notes (Signed)
Recreation Therapy Notes  Date: 04/30/2019  Time: 9:30 am  Location: Craft Room  Behavioral response: Appropriate   Intervention Topic: Goals   Discussion/Intervention:  Group content on today was focused on goals. Patients described what goals are and how they define goals. Individuals expressed how they go about setting goals and reaching them. The group identified how important goals are and if they make short term goals to reach long term goals. Patients described how many goals they work on at a time and what affects them not reaching their goal. Individuals described how much time they put into planning and obtaining their goals. The group participated in the intervention "My Goal Board" and made personal goal boards to help them achieve their goal. Clinical Observations/Feedback:  [name] and explained that goals are everyday accomplishments. He stated that his goal is to communicate with his family everyday. Participant expressed that in order to be successful in accomplishing a goal you need support. Individual was social with peers and staff while participating in the intervention during group.  Swan Fairfax LRT/CTRS         Eashan Schipani 04/30/2019 1:12 PM

## 2019-04-30 NOTE — [facility] Group Notes (Signed)
[facility] Group Notes:  (Nursing/MHT/Case Management/Adjunct)  Date:  04/30/2019  Time:  9:30 PM  Type of Therapy:  Group Therapy  Participation Level:  Active  Participation Quality:  Appropriate, Sharing and Supportive  Affect:  Appropriate  Cognitive:  Alert and Appropriate  Insight:  Appropriate and Good  Engagement in Group:  Engaged and Supportive  Modes of Intervention:  Education and Socialization  Summary of Progress/Problems: PT want to continue to communicate about his depression with family and to seek proper therapy. He needs to stay strong for his family due to the loss of his mother.  Landry Mellow 04/30/2019, 9:30 PM

## 2019-04-30 NOTE — Plan of Care (Signed)
D- Patient alert and oriented. Patient presents in a pleasant mood on assessment stating that he slept good last night and had no complaints to voice to this Clinical research associate. Patient reported that his depression is "better today, since last night" and that his anxiety is "very low". Patient stated that ever since he spoke with some of his family members, he has felt so much better. Patient denies SI, HI, AVH, and pain at this time. Patient's goal for today is "depression/communication", in which he will "talk" in order to achieve his goal.  A- Scheduled medications administered to patient, per MD orders. Support and encouragement provided.  Routine safety checks conducted every 15 minutes.  Patient informed to notify staff with problems or concerns.  R- No adverse drug reactions noted. Patient contracts for safety at this time. Patient compliant with medications and treatment plan. Patient receptive, calm, and cooperative. Patient interacts well with others on the unit.  Patient remains safe at this time.  Problem: Education: Goal: Knowledge of [facility] Education information/materials will improve Outcome: Progressing Goal: Emotional status will improve Outcome: Progressing Goal: Mental status will improve Outcome: Progressing Goal: Verbalization of understanding the information provided will improve Outcome: Progressing   Problem: Health Behavior/Discharge Planning: Goal: Identification of resources available to assist in meeting health care needs will improve Outcome: Progressing Goal: Compliance with treatment plan for underlying cause of condition will improve Outcome: Progressing   Problem: Education: Goal: Utilization of techniques to improve thought processes will improve Outcome: Progressing Goal: Knowledge of the prescribed therapeutic regimen will improve Outcome: Progressing   Problem: Activity: Goal: Interest or engagement in leisure activities will improve Outcome:  Progressing Goal: Imbalance in normal sleep/wake cycle will improve Outcome: Progressing   Problem: Coping: Goal: Coping ability will improve Outcome: Progressing Goal: Will verbalize feelings Outcome: Progressing   Problem: Safety: Goal: Ability to disclose and discuss suicidal ideas will improve Outcome: Progressing Goal: Ability to identify and utilize support systems that promote safety will improve Outcome: Progressing   Problem: Self-Concept: Goal: Will verbalize positive feelings about self Outcome: Progressing Goal: Level of anxiety will decrease Outcome: Progressing

## 2019-04-30 NOTE — [facility] Group Notes (Signed)
[facility] Group Notes:  (Nursing/MHT/Case Management/Adjunct)  Date:  04/30/2019  Time:  8:30AM Type of Therapy:  Community Meeting   Participation Level:  Active  Participation Quality:  Appropriate and Attentive  Affect:  Appropriate  Cognitive:  Appropriate  Insight:  Appropriate  Engagement in Group:  Engaged  Modes of Intervention:  Discussion and Orientation  Summary of Progress/Problems:  Nicholas Reese 04/30/2019, 9:20 AM

## 2019-04-30 NOTE — [facility] Group Notes (Signed)
LCSW Group Therapy Note  04/30/2019 1:00 PM  Type of Therapy/Topic:  Group Therapy:  Feelings about Diagnosis  Participation Level:  Did Not Attend   Description of Group:   This group will allow patients to explore their thoughts and feelings about diagnoses they have received. Patients will be guided to explore their level of understanding and acceptance of these diagnoses. Facilitator will encourage patients to process their thoughts and feelings about the reactions of others to their diagnosis and will guide patients in identifying ways to discuss their diagnosis with significant others in their lives. This group will be process-oriented, with patients participating in exploration of their own experiences, giving and receiving support, and processing challenge from other group members.   Therapeutic Goals: 1. Patient will demonstrate understanding of diagnosis as evidenced by identifying two or more symptoms of the disorder 2. Patient will be able to express two feelings regarding the diagnosis 3. Patient will demonstrate their ability to communicate their needs through discussion and/or role play  Summary of Patient Progress: X  Therapeutic Modalities:   Cognitive Behavioral Therapy Brief Therapy Feelings Identification   Ariday Brinker, MSW, LCSW 04/30/2019 12:47 PM  
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
## 2019-05-01 MED ORDER — TRAZODONE HCL 50 MG PO TABS
50.0000 mg | ORAL_TABLET | Freq: Every evening | ORAL | Status: DC | PRN
  Administered 2019-05-01: 50 mg via ORAL
  Filled 2019-05-01: qty 1

## 2019-05-01 NOTE — [facility] Group Notes (Signed)
[facility] Group Notes:  (Nursing/MHT/Case Management/Adjunct)  Date:  05/01/2019  Time:  10:03 AM  Type of Therapy:  Community Meeting  Participation Level:  Active  Participation Quality:  Appropriate and Attentive  Affect:  Appropriate  Cognitive:  Alert and Appropriate  Insight:  Appropriate  Engagement in Group:  Engaged  Modes of Intervention:  Discussion and Education  Summary of Progress/Problems:  Nicholas Reese 05/01/2019, 10:03 AM

## 2019-05-01 NOTE — Progress Notes (Signed)
   05/01/19 1500  Clinical Encounter Type  Visited With Patient;Other (Comment)  Visit Type Initial;Spiritual support;Behavioral Health  Referral From Chaplain  Consult/Referral To Chaplain  While doing rounds Chaplain briefly visited with patient. Chaplain offered pastoral presence while patient was sitting outside.

## 2019-05-01 NOTE — Plan of Care (Signed)
D- Patient alert and oriented. Patient presented in a pleasant mood on assessment stating that he slept "ok until 1am", however, he did not have any complaints to voice to this Clinical research associate. Patient denied depression and anxiety, stating "I'm better". Patient also denied SI, HI, AVH, and pain at this time. Patient's goal for today is "home", in which he will "consult doctor", in order to achieve his goal.  A- Scheduled medications administered to patient, per MD orders. Support and encouragement provided.  Routine safety checks conducted every 15 minutes.  Patient informed to notify staff with problems or concerns.  R- No adverse drug reactions noted. Patient contracts for safety at this time. Patient compliant with medications and treatment plan. Patient receptive, calm, and cooperative. Patient interacts well with others on the unit.  Patient remains safe at this time.  Problem: Education: Goal: Knowledge of [facility] Education information/materials will improve Outcome: Progressing Goal: Emotional status will improve Outcome: Progressing Goal: Mental status will improve Outcome: Progressing Goal: Verbalization of understanding the information provided will improve Outcome: Progressing   Problem: Health Behavior/Discharge Planning: Goal: Identification of resources available to assist in meeting health care needs will improve Outcome: Progressing Goal: Compliance with treatment plan for underlying cause of condition will improve Outcome: Progressing   Problem: Education: Goal: Utilization of techniques to improve thought processes will improve Outcome: Progressing Goal: Knowledge of the prescribed therapeutic regimen will improve Outcome: Progressing   Problem: Activity: Goal: Interest or engagement in leisure activities will improve Outcome: Progressing Goal: Imbalance in normal sleep/wake cycle will improve Outcome: Progressing   Problem: Coping: Goal: Coping ability will  improve Outcome: Progressing Goal: Will verbalize feelings Outcome: Progressing   Problem: Safety: Goal: Ability to disclose and discuss suicidal ideas will improve Outcome: Progressing Goal: Ability to identify and utilize support systems that promote safety will improve Outcome: Progressing   Problem: Self-Concept: Goal: Will verbalize positive feelings about self Outcome: Progressing Goal: Level of anxiety will decrease Outcome: Progressing

## 2019-05-01 NOTE — [facility] Group Notes (Signed)
LCSW Group Therapy Note  05/01/2019 11:27 AM  Type of Therapy/Topic:  Group Therapy:  Emotion Regulation  Participation Level:  Active   Description of Group:   The purpose of this group is to assist patients in learning to regulate negative emotions and experience positive emotions. Patients will be guided to discuss ways in which they have been vulnerable to their negative emotions. These vulnerabilities will be juxtaposed with experiences of positive emotions or situations, and patients will be challenged to use positive emotions to combat negative ones. Special emphasis will be placed on coping with negative emotions in conflict situations, and patients will process healthy conflict resolution skills.  Therapeutic Goals: 1. Patient will identify two positive emotions or experiences to reflect on in order to balance out negative emotions 2. Patient will label two or more emotions that they find the most difficult to experience 3. Patient will demonstrate positive conflict resolution skills through discussion and/or role plays  Summary of Patient Progress: Pt was appropriate and respectful in group. Pt was able to identify different emotions and offer suggestions to other group members while in the group setting. Pt discussed coping skills and appropriate reactions to different situations.   Therapeutic Modalities:   Cognitive Behavioral Therapy Feelings Identification Dialectical Behavioral Therapy   Iris Pert, MSW, LCSW Clinical Social Work 05/01/2019 11:27 AM

## 2019-05-01 NOTE — Progress Notes (Signed)
Recreation Therapy Notes  Date: 05/01/2019  Time: 9:30 am  Location: Craft Room  Behavioral response: Appropriate   Intervention Topic: Problem Solving   Discussion/Intervention:  Group content on today was focused on problem solving. The group described what problem solving is. Patients expressed how problems affect them and how they deal with problems. Individuals identified healthy ways to deal with problems. Patients explained what normally happens to them when they do not deal with problems. The group expressed reoccurring problems for them. The group participated in the intervention "Ways to Solve problems" where patients were given a chance to explore different ways to solve problems.  Clinical Observations/Feedback:  [name] and defined problem solving as not breaking stuff when mad or upset. He explained that he problems solves by talking to someone [place] Participant expressed that his pride and resources has been hindering him from getting the help he needs. Individual was social with peers and staff while participating in the intervention during group.  Julieanne Hadsall LRT/CTRS         Nicholas Reese 05/01/2019 12:07 PM

## 2019-05-01 NOTE — Progress Notes (Signed)
Patient was in the day room upon arrival to the unit. Patient pleasant during assessment denying SI/HI/AVH with this writer. Patient endorses anxiety and depression. Pt didn't have any medications scheduled this evening and but did request something for sleep, see MAR. Patient given education, support and encouragement to be active in his treatment plan. Patient being monitored Q 15 minutes for safety per unit protocol. Patient remains safe on the unit. 
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
## 2019-05-01 NOTE — Plan of Care (Signed)
Patient stated he was feeling a lot better and came up with a discharge plan today  Problem: Education: Goal: Emotional status will improve Outcome: Progressing Goal: Mental status will improve Outcome: Progressing

## 2019-05-02 MED ORDER — TRAZODONE HCL 50 MG PO TABS: 50.0000 mg | ORAL_TABLET | Freq: Every evening | ORAL | 1 refills | Status: DC | PRN

## 2019-05-02 MED ORDER — PAROXETINE HCL 20 MG PO TABS: 20.0000 mg | ORAL_TABLET | Freq: Every day | ORAL | 0 refills | Status: DC

## 2019-05-02 MED ORDER — TRAZODONE HCL 50 MG PO TABS: 50.0000 mg | ORAL_TABLET | Freq: Every evening | ORAL | 0 refills | Status: DC | PRN

## 2019-05-02 MED ORDER — PAROXETINE HCL 20 MG PO TABS: 20.0000 mg | ORAL_TABLET | Freq: Every day | ORAL | 1 refills | Status: DC

## 2019-05-02 MED ORDER — LEVETIRACETAM 1000 MG PO TABS: 1000.0000 mg | ORAL_TABLET | Freq: Two times a day (BID) | ORAL | 1 refills | Status: DC

## 2019-05-02 MED ORDER — LEVETIRACETAM 1000 MG PO TABS: 1000.0000 mg | ORAL_TABLET | Freq: Two times a day (BID) | ORAL | 0 refills | Status: DC

## 2019-05-02 NOTE — Progress Notes (Signed)
Recreation Therapy Notes  INPATIENT RECREATION TR PLAN  Patient Details Name: Nicholas Reese MRN: [id number] DOB: 06-10-1980 Today's Date: 05/02/2019  Rec Therapy Plan Is patient appropriate for Therapeutic Recreation?: Yes Treatment times per week: at least 3 Estimated Length of Stay: 5-7 days TR Treatment/Interventions: Group participation (Comment)  Discharge Criteria Pt will be discharged from therapy if:: Discharged Treatment plan/goals/alternatives discussed and agreed upon by:: Patient/family  Discharge Summary Short term goals set: Patient will engage in groups without prompting or encouragement from LRT x3 group sessions within 5 recreation therapy group sessions Short term goals met: Complete Progress toward goals comments: Groups attended Which groups?: AAA/T, Goal setting, Other (Comment)(Problem Solving) Reason goals not met: N/A Therapeutic equipment acquired: N/A Reason patient discharged from therapy: Discharge from hospital Pt/family agrees with progress & goals achieved: Yes Date patient discharged from therapy: 05/02/19   Deagan Sevin 05/02/2019, 3:14 PM

## 2019-05-02 NOTE — Progress Notes (Signed)
Pt was educated on dc plan and verbalizes understanding. Pt denies SI, HI and AVH. Pt received belongings and dc packet. Torrie Mayers RN

## 2019-05-02 NOTE — Plan of Care (Signed)
Pt denies depression , anxiety, SI, HI and AVH. Pt was educated on care plan and verbalizes understanding. Pt was encouraged to attend groups. Torrie Mayers RN Problem: Education: Goal: Knowledge of [facility] Education information/materials will improve Outcome: Adequate for Discharge Goal: Emotional status will improve Outcome: Adequate for Discharge Goal: Mental status will improve Outcome: Adequate for Discharge Goal: Verbalization of understanding the information provided will improve Outcome: Adequate for Discharge   Problem: Health Behavior/Discharge Planning: Goal: Identification of resources available to assist in meeting health care needs will improve Outcome: Adequate for Discharge Goal: Compliance with treatment plan for underlying cause of condition will improve Outcome: Adequate for Discharge   Problem: Education: Goal: Utilization of techniques to improve thought processes will improve Outcome: Adequate for Discharge Goal: Knowledge of the prescribed therapeutic regimen will improve Outcome: Adequate for Discharge   Problem: Activity: Goal: Interest or engagement in leisure activities will improve Outcome: Adequate for Discharge Goal: Imbalance in normal sleep/wake cycle will improve Outcome: Adequate for Discharge   Problem: Coping: Goal: Coping ability will improve Outcome: Adequate for Discharge Goal: Will verbalize feelings Outcome: Adequate for Discharge   Problem: Safety: Goal: Ability to disclose and discuss suicidal ideas will improve Outcome: Adequate for Discharge Goal: Ability to identify and utilize support systems that promote safety will improve Outcome: Adequate for Discharge   Problem: Self-Concept: Goal: Will verbalize positive feelings about self Outcome: Adequate for Discharge Goal: Level of anxiety will decrease Outcome: Adequate for Discharge

## 2019-05-02 NOTE — [facility] Group Notes (Signed)
[facility] Group Notes:  (Nursing/MHT/Case Management/Adjunct)  Date:  05/02/2019  Time:  2:35 AM  Type of Therapy:  Group Therapy  Participation Level:  Active  Participation Quality:  Appropriate, Sharing and Supportive  Affect:  Appropriate  Cognitive:  Alert and Appropriate  Insight:  Appropriate and Good  Engagement in Group:  Developing/Improving, Engaged and Supportive  Modes of Intervention:  Discussion, Education and Support  Summary of Progress/Problems: PT he wants to continue his medication. He wants to continue being compliance in taking meds. He said he feels 100% better. When he leaves, he wants to continue therapy and his medication and wants to get setup with [facility]  Nicholas Reese Carrington Clamp 05/02/2019, 2:35 AM

## 2019-05-02 NOTE — Progress Notes (Signed)
  [facility] Adult Case Management Discharge Plan :  Will you be returning to the same living situation after discharge:  Yes,  lives with sister At discharge, do you have transportation home?: Yes,  ex-wife or mother in law will pick up Do you have the ability to pay for your medications: No.  Release of information consent forms completed and in the chart;  Patient's signature needed at discharge.  Patient to Follow up at: Follow-up Information    [facility] Follow up.   Why: Please meet with Lorella Nimrod, Peer Support on 05/08/2019 at 7AM via Zoom.  Contact information: [address] [place] [address] [phone]           Next level of care provider has access to [facility] Link:no  Safety Planning and Suicide Prevention discussed: Yes,  [place], sister  Have you used any form of tobacco in the last 30 days? (Cigarettes, Smokeless Tobacco, Cigars, and/or Pipes): No  Has patient been referred to the Quitline?: N/A patient is not a smoker  Patient has been referred for addiction treatment: N/A  Suzan Slick, LCSW 05/02/2019, 11:24 AM
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
## 2019-05-02 NOTE — [facility]HH Suicide Risk Assessment (Signed)
[facility] Discharge Suicide Risk Assessment   Principal Problem: Severe recurrent major depression without psychotic features [facility]) Discharge Diagnoses: Principal Problem:   Severe recurrent major depression without psychotic features ([facility]) Active Problems:   Seizure disorder ([facility])   Total Time spent with patient: 30 minutes  Musculoskeletal: Strength & Muscle Tone: within normal limits Gait & Station: normal Patient leans: N/A  Psychiatric Specialty Exam: Review of Systems  Constitutional: Negative.   HENT: Negative.   Eyes: Negative.   Respiratory: Negative.   Cardiovascular: Negative.   Gastrointestinal: Negative.   Musculoskeletal: Negative.   Skin: Negative.   Neurological: Negative.   Psychiatric/Behavioral: Negative.     Blood pressure 111/74, pulse 89, temperature 97.7 F (36.5 C), temperature source Oral, resp. rate 17, height 6' (1.829 m), weight 74.4 kg, SpO2 100 %.Body mass index is 22.24 kg/m.  General Appearance: Casual  Eye Contact::  Good  Speech:  Clear and Coherent409  Volume:  Normal  Mood:  Euthymic  Affect:  Congruent  Thought Process:  Goal Directed  Orientation:  Full (Time, Place, and Person)  Thought Content:  Logical  Suicidal Thoughts:  No  Homicidal Thoughts:  No  Memory:  Immediate;   Fair Recent;   Fair Remote;   Fair  Judgement:  Fair  Insight:  Fair  Psychomotor Activity:  Normal  Concentration:  Fair  Recall:  [employer] of Knowledge:Fair  Language: Fair  Akathisia:  No  Handed:  Right  AIMS (if indicated):     Assets:  Desire for Improvement Housing Physical Health  Sleep:  Number of Hours: 6.75  Cognition: WNL  ADL's:  Intact   Mental Status Per Nursing Assessment::   On Admission:  NA  Demographic Factors:  Male, Divorced or widowed and Caucasian  Loss Factors: Financial problems/change in socioeconomic status  Historical Factors: Prior suicide attempts  Risk Reduction Factors:   Employed, Positive social  support and Positive therapeutic relationship  Continued Clinical Symptoms:  Depression:   Impulsivity  Cognitive Features That Contribute To Risk:  None    Suicide Risk:  Minimal: No identifiable suicidal ideation.  Patients presenting with no risk factors but with morbid ruminations; may be classified as minimal risk based on the severity of the depressive symptoms  Follow-up Information    [facility] Follow up.   Why: Please meet with Lorella Nimrod, Peer Support on 05/08/2019 at 7AM via Zoom.  Contact information: [address] [place] [address] [phone]           Plan Of Care/Follow-up recommendations:  Activity:  Activity as tolerated Diet:  Regular diet Other:  Follow-up with outpatient treatment with [facility]  Mordecai Rasmussen, MD 05/02/2019, 10:58 AM

## 2019-05-02 NOTE — Plan of Care (Signed)
Patient stated he had a good day and is hopeful he is getting out tomorrow  Problem: Education: Goal: Emotional status will improve Outcome: Progressing Goal: Mental status will improve Outcome: Progressing

## 2019-05-02 NOTE — Progress Notes (Signed)
Patient was in the day room upon arrival to the unit. Patient pleasant during assessment denying SI/HI/AVH with this Clinical research associate. Patient endorses anxiety and depression. Pt didn't have any medications scheduled this evening and but did request something for sleep, see MAR. Patient given education, support and encouragement to be active in his treatment plan. Patient being monitored Q 15 minutes for safety per unit protocol. Patient remains safe on the unit.

## 2019-05-02 NOTE — Progress Notes (Signed)
Recreation Therapy Notes  Date: 05/02/2019  Time: 9:30 am  Location: Outside court yard   Behavioral response: Appropriate   Intervention Topic: Animal Assisted Therapy   Discussion/Intervention:  Animal Assisted Therapy took place today during group.  Animal Assisted Therapy is the planned inclusion of an animal in a patient's treatment plan. The patients were able to engage in therapy with an animal during group. Participants were educated on what a service dog is and the different between a support dog and a service dog. Patient were informed on the many animal needs there are and how their needs are similar. Individuals were enlightened on the process to get a service animal or support animal. Patients got the opportunity to pet the animal and were offered emotional support from the animal and staff.  Clinical Observations/Feedback:  [name] and was engaged in group. Participant was focused and asked appropriate questions regarding the group topic and task. Individual was social with peers and staff while participating in group.  Nicholas Reese LRT/CTRS         Nicholas Reese 05/02/2019 11:19 AM

## 2019-08-04 ENCOUNTER — Observation Stay
Admission: EM | Admit: 2019-08-04 | Discharge: 2019-08-05 | Disposition: A | Discharge status: Home / Self Care | Payer: PRIVATE HEALTH INSURANCE | Attending: Internal Medicine | Admitting: Internal Medicine

## 2019-08-04 ENCOUNTER — Observation Stay: Payer: PRIVATE HEALTH INSURANCE

## 2019-08-04 ENCOUNTER — Other Ambulatory Visit: Payer: Self-pay

## 2019-08-04 ENCOUNTER — Emergency Department: Payer: PRIVATE HEALTH INSURANCE

## 2019-08-04 DIAGNOSIS — Z91128 Patient's intentional underdosing of medication regimen for other reason: Secondary | ICD-10-CM | POA: Insufficient documentation

## 2019-08-04 DIAGNOSIS — R531 Weakness: Secondary | ICD-10-CM

## 2019-08-04 DIAGNOSIS — Z20822 Contact with and (suspected) exposure to covid-19: Secondary | ICD-10-CM | POA: Insufficient documentation

## 2019-08-04 DIAGNOSIS — Z87891 Personal history of nicotine dependence: Secondary | ICD-10-CM | POA: Insufficient documentation

## 2019-08-04 DIAGNOSIS — F329 Major depressive disorder, single episode, unspecified: Secondary | ICD-10-CM | POA: Insufficient documentation

## 2019-08-04 DIAGNOSIS — Z885 Allergy status to narcotic agent status: Secondary | ICD-10-CM | POA: Insufficient documentation

## 2019-08-04 DIAGNOSIS — G40909 Epilepsy, unspecified, not intractable, without status epilepticus: Principal | ICD-10-CM

## 2019-08-04 DIAGNOSIS — T426X6A Underdosing of other antiepileptic and sedative-hypnotic drugs, initial encounter: Secondary | ICD-10-CM | POA: Insufficient documentation

## 2019-08-04 DIAGNOSIS — R569 Unspecified convulsions: Secondary | ICD-10-CM

## 2019-08-04 DIAGNOSIS — R29818 Other symptoms and signs involving the nervous system: Secondary | ICD-10-CM | POA: Diagnosis present

## 2019-08-04 DIAGNOSIS — F411 Generalized anxiety disorder: Secondary | ICD-10-CM | POA: Insufficient documentation

## 2019-08-04 DIAGNOSIS — Z79899 Other long term (current) drug therapy: Secondary | ICD-10-CM | POA: Insufficient documentation

## 2019-08-04 LAB — CBC WITH DIFFERENTIAL/PLATELET
Abs Immature Granulocytes: 0.02 10*3/uL (ref 0.00–0.07)
Basophils Absolute: 0 10*3/uL (ref 0.0–0.1)
Basophils Relative: 1 %
Eosinophils Absolute: 0.2 10*3/uL (ref 0.0–0.5)
Eosinophils Relative: 3 %
HCT: 41.3 % (ref 39.0–52.0)
Hemoglobin: 14.2 g/dL (ref 13.0–17.0)
Immature Granulocytes: 0 %
Lymphocytes Relative: 38 %
Lymphs Abs: 2.4 10*3/uL (ref 0.7–4.0)
MCH: 33.2 pg (ref 26.0–34.0)
MCHC: 34.4 g/dL (ref 30.0–36.0)
MCV: 96.5 fL (ref 80.0–100.0)
Monocytes Absolute: 0.3 10*3/uL (ref 0.1–1.0)
Monocytes Relative: 5 %
Neutro Abs: 3.3 10*3/uL (ref 1.7–7.7)
Neutrophils Relative %: 53 %
Platelets: 272 10*3/uL (ref 150–400)
RBC: 4.28 MIL/uL (ref 4.22–5.81)
RDW: 13.2 % (ref 11.5–15.5)
WBC: 6.2 10*3/uL (ref 4.0–10.5)
nRBC: 0 % (ref 0.0–0.2)

## 2019-08-04 LAB — ACETAMINOPHEN LEVEL: Acetaminophen (Tylenol), Serum: 65 ug/mL — ABNORMAL HIGH (ref 10–30)

## 2019-08-04 LAB — COMPREHENSIVE METABOLIC PANEL
ALT: 13 U/L (ref 0–44)
AST: 16 U/L (ref 15–41)
Albumin: 4.4 g/dL (ref 3.5–5.0)
Alkaline Phosphatase: 60 U/L (ref 38–126)
Anion gap: 7 (ref 5–15)
BUN: 12 mg/dL (ref 6–20)
CO2: 23 mmol/L (ref 22–32)
Calcium: 9 mg/dL (ref 8.9–10.3)
Chloride: 108 mmol/L (ref 98–111)
Creatinine, Ser: 1.14 mg/dL (ref 0.61–1.24)
GFR calc Af Amer: >60 mL/min (ref >60)
GFR calc non Af Amer: >60 mL/min (ref >60)
Glucose, Bld: 89 mg/dL (ref 70–99)
Potassium: 3.7 mmol/L (ref 3.5–5.1)
Sodium: 138 mmol/L (ref 135–145)
Total Bilirubin: 0.7 mg/dL (ref 0.3–1.2)
Total Protein: 7.4 g/dL (ref 6.5–8.1)

## 2019-08-04 LAB — PROTIME-INR
INR: 1 (ref 0.8–1.2)
Prothrombin Time: 12.8 seconds (ref 11.4–15.2)

## 2019-08-04 LAB — GLUCOSE, CAPILLARY: Glucose-Capillary: 71 mg/dL (ref 70–99)

## 2019-08-04 LAB — APTT: aPTT: 34 seconds (ref 24–36)

## 2019-08-04 LAB — SALICYLATE LEVEL: Salicylate Lvl: <7 mg/dL — ABNORMAL LOW (ref 7.0–30.0)

## 2019-08-04 LAB — SARS CORONAVIRUS 2 BY RT PCR (HOSPITAL ORDER, PERFORMED IN ~~LOC~~ HOSPITAL LAB): SARS Coronavirus 2: NEGATIVE

## 2019-08-04 IMAGING — CT CT HEAD W/O CM
3 series · 16 of 47 positions shown, 19 images · non-contrast
Comparison: Head CT [DATE].

CLINICAL DATA: Seizure without abnormal neuro exam. History of
seizures.

EXAM:
CT HEAD WITHOUT CONTRAST
TECHNIQUE: Contiguous axial images were obtained from the base of the skull
through the vertex without intravenous contrast.

[Series 3: head wo · axial · 0.47mm/px · z∈[-126,-1]mm · 10 of 30 slices shown, 13 images]
[im 3/30  brain]
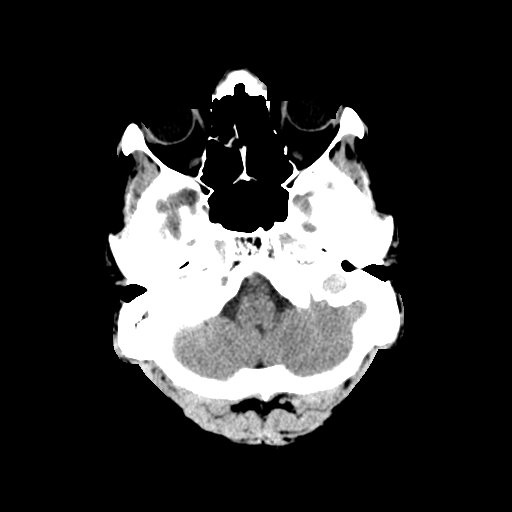
[im 3/30  bone]
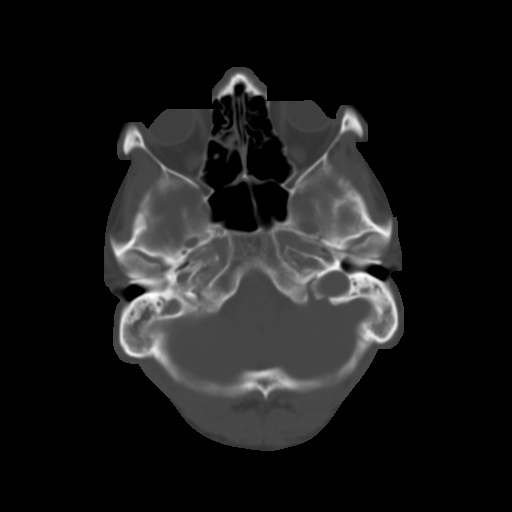
[im 6/30  brain]
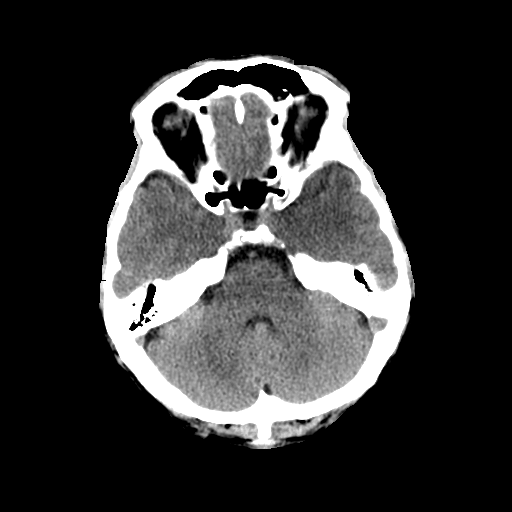
[im 9/30  brain]
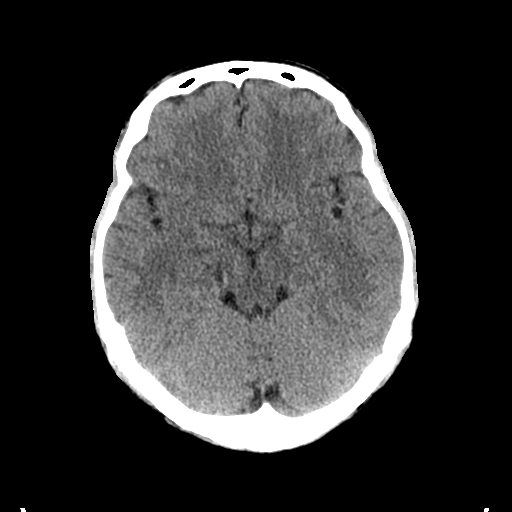
[im 11/30  brain]
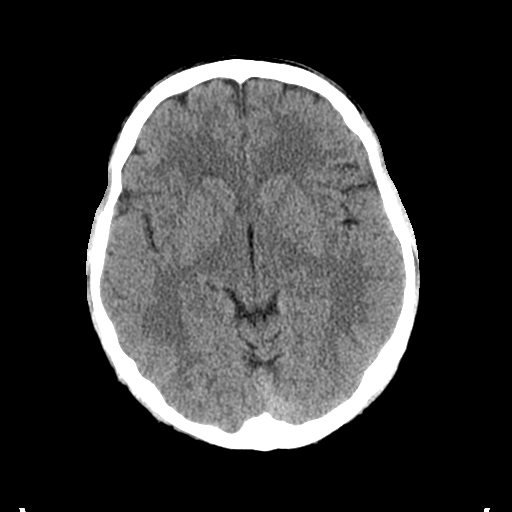
[im 14/30  brain]
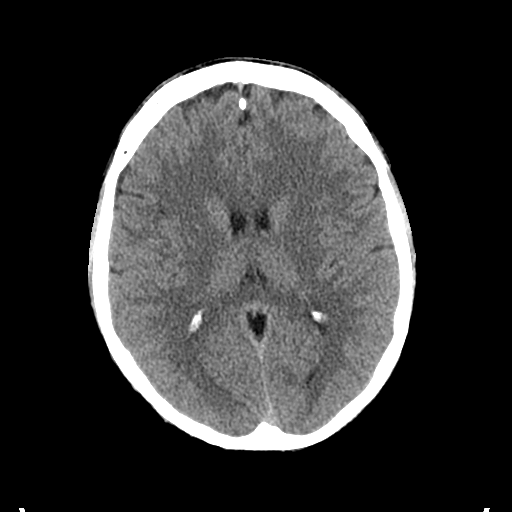
[im 14/30  bone]
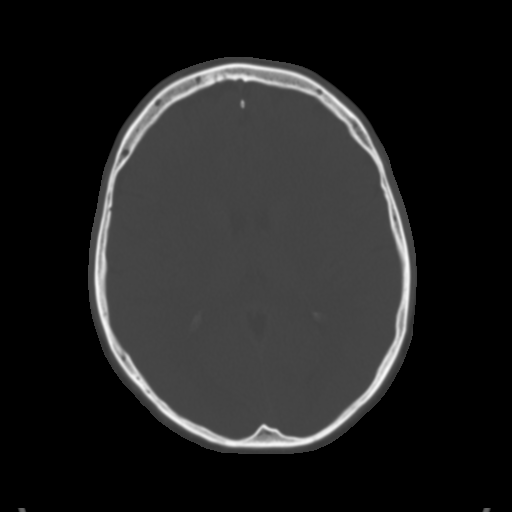
[im 17/30  brain]
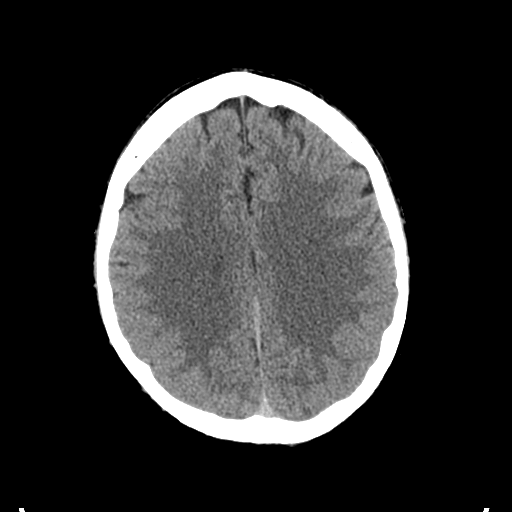
[im 20/30  brain]
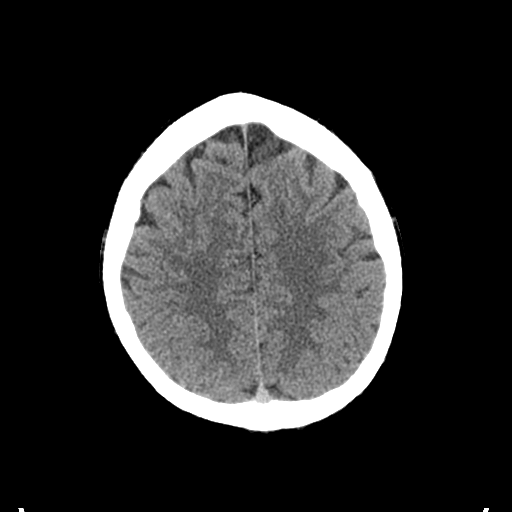
[im 23/30  brain]
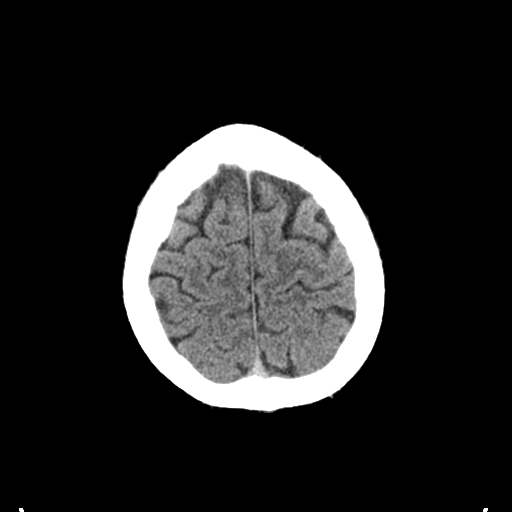
[im 25/30  brain]
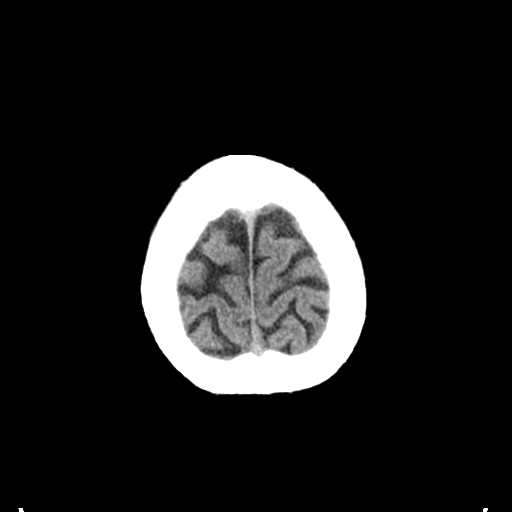
[im 25/30  bone]
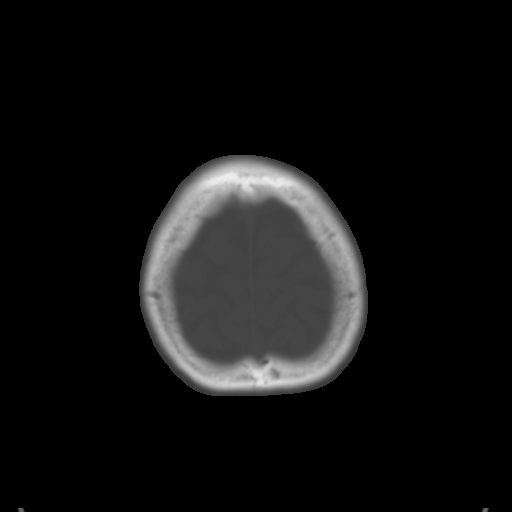
[im 28/30  brain]
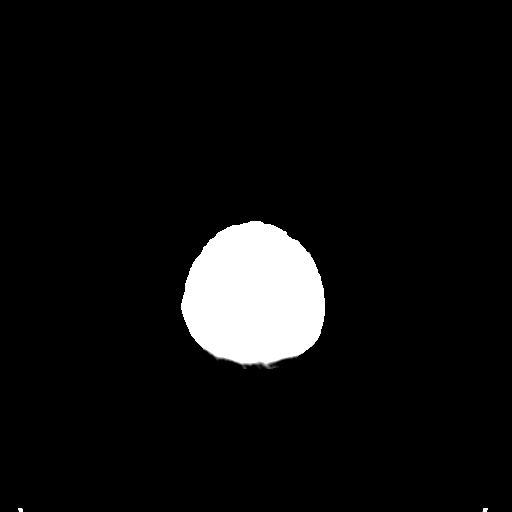

[Series 4: coronal soft tissue · coronal · 0.31mm/px · 3 of 65 slices shown]
[im 22/65  brain]
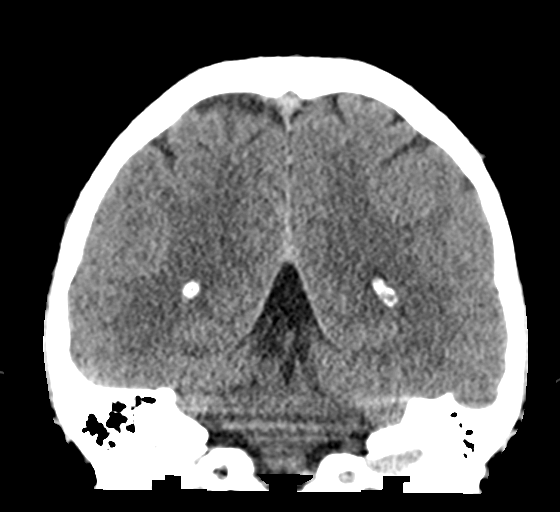
[im 29/65  brain]
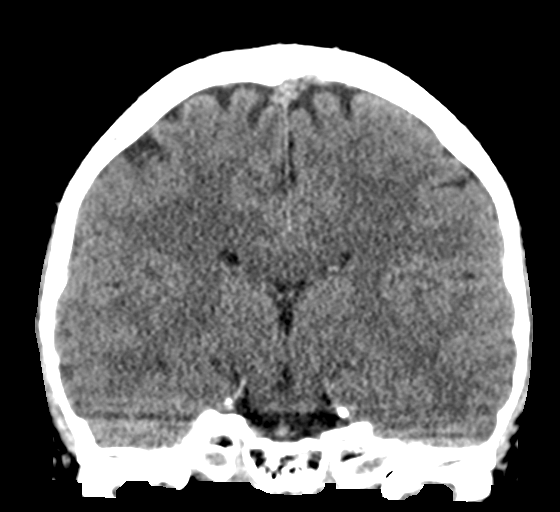
[im 36/65  brain]
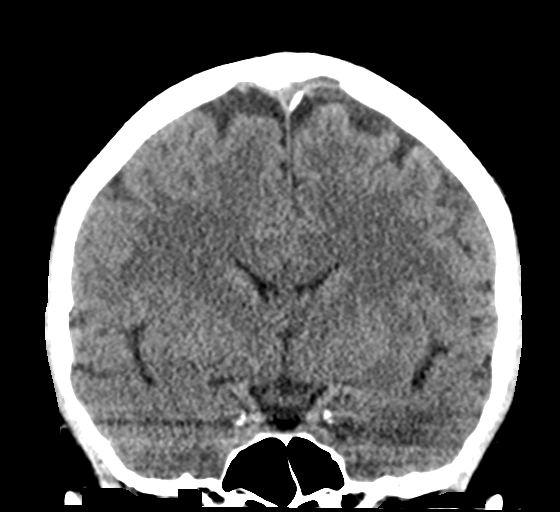

[Series 5: sagittal soft tissue · sagittal · 0.31mm/px · 3 of 59 slices shown]
[im 20/59  brain]
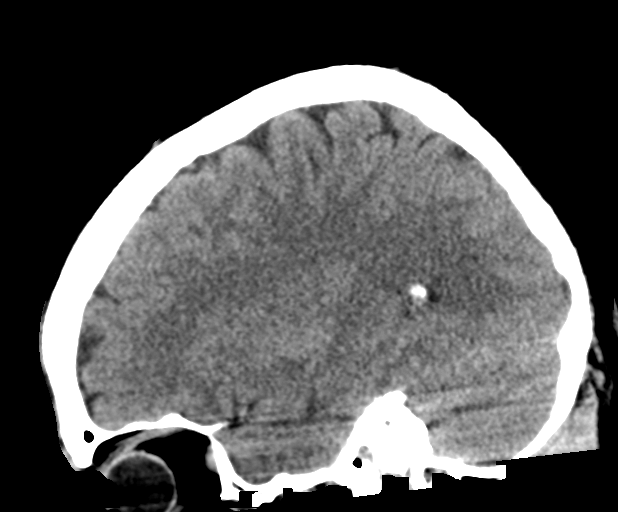
[im 30/59  brain]
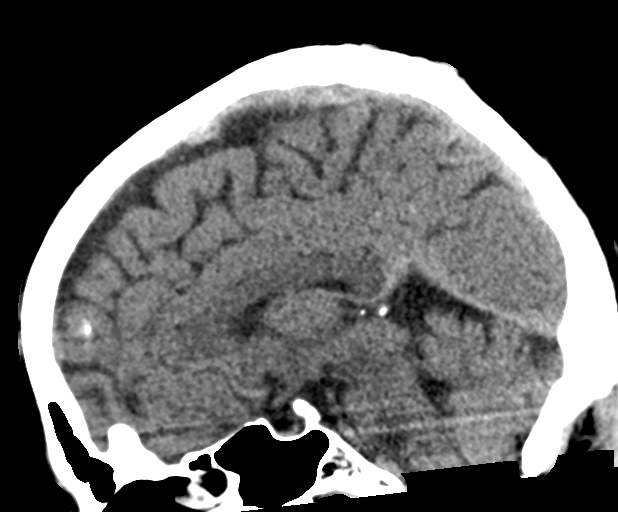
[im 39/59  brain]
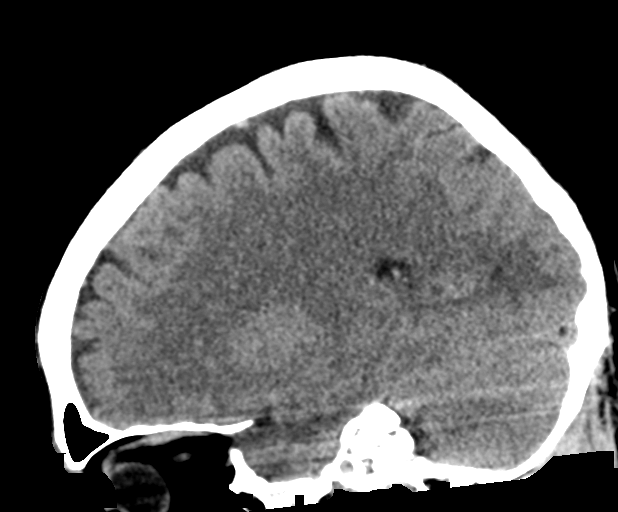

[16 of 47 positions shown; findings below may reference images not displayed]

FINDINGS: Brain: No intracranial hemorrhage, mass effect, or midline shift. No
hydrocephalus. The basilar cisterns are patent. No evidence of
territorial infarct or acute ischemia. No extra-axial or
intracranial fluid collection.

Vascular: No hyperdense vessel or unexpected calcification.

Skull: No fracture or focal lesion.

Sinuses/Orbits: Paranasal sinuses and mastoid air cells are clear.
The visualized orbits are unremarkable.

Other: None.
IMPRESSION: Negative noncontrast head CT.
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
## 2019-08-04 IMAGING — MR MR HEAD W/O CM
8 of 12 series · 28 of 48 positions shown · non-contrast
Comparison: None.

CLINICAL DATA: Seizure

EXAM:
MRI HEAD WITHOUT CONTRAST
TECHNIQUE: Multiplanar, multiecho pulse sequences of the brain and surrounding
structures were obtained without intravenous contrast.

[Series 5: T1 · sagittal · 5.0mm · 0.62mm/px · 2 of 24 slices shown (1 of 2)]
[im 1/24]
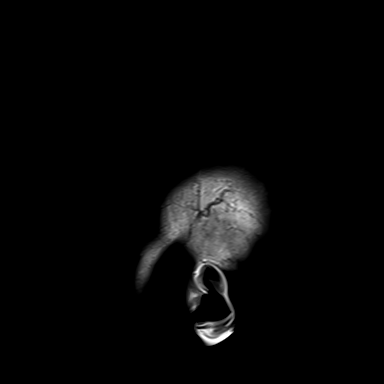
[im 24/24]
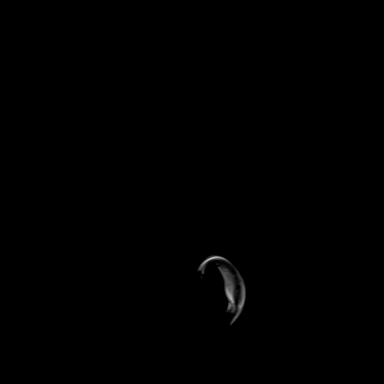

[Series 6: ax dwi_tracew · axial · 3.0mm · 0.60mm/px · z∈[-77,+81]mm · 4 of 50 slices shown]
[im 1/50]
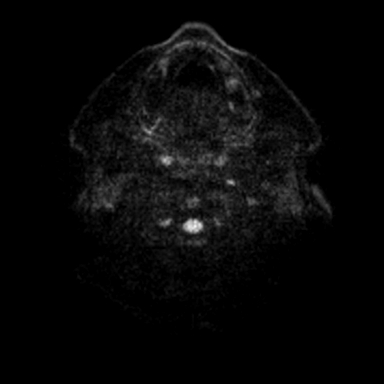
[im 17/50]
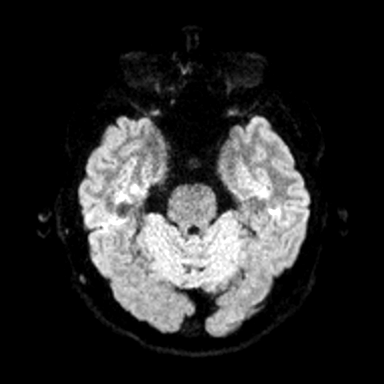
[im 33/50]
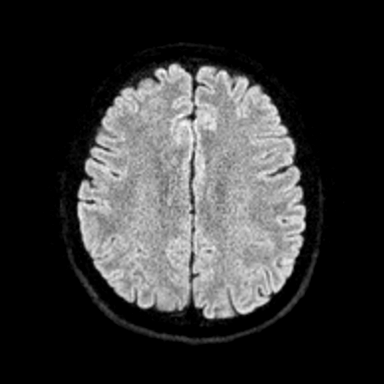
[im 50/50]
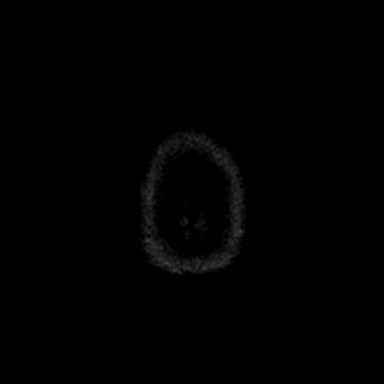

[Series 7: ax dwi_adc · axial · 3.0mm · 0.60mm/px · z∈[-77,-25]mm · 2 of 50 slices shown]
[im 1/50]
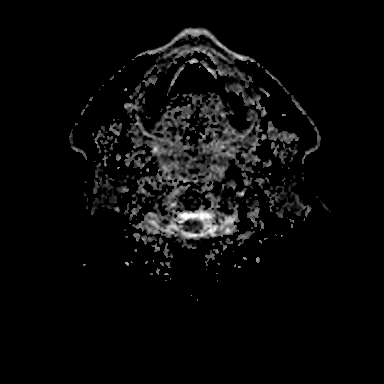
[im 17/50]
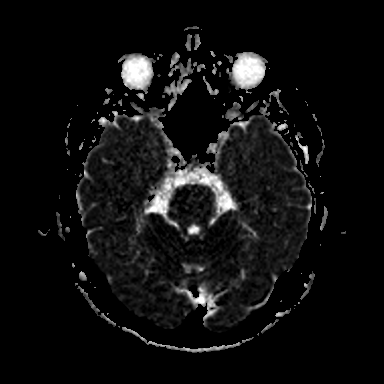

[Series 10: T2 · axial · 5.0mm · 0.53mm/px · z∈[-72,+80]mm · 2 of 27 slices shown (1 of 2)]
[im 1/27]
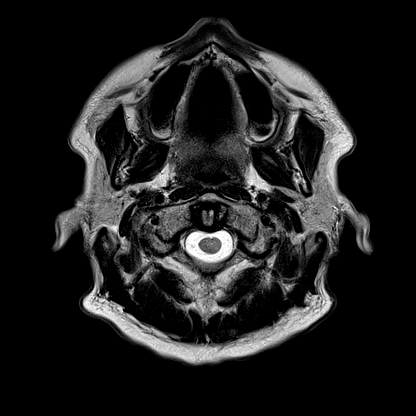
[im 27/27]
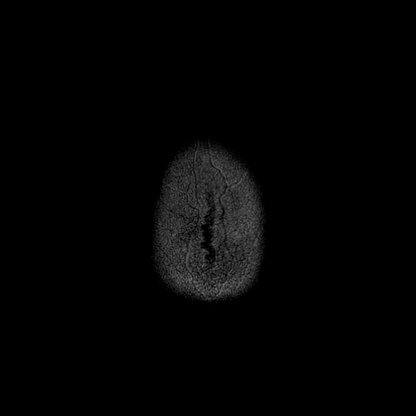

[Series 15: FLAIR · axial · 3.0mm · 0.53mm/px · z∈[-72,+80]mm · 4 of 53 slices shown (1 of 2)]
[im 1/53]
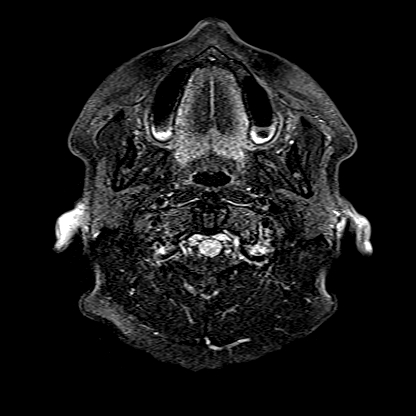
[im 18/53]
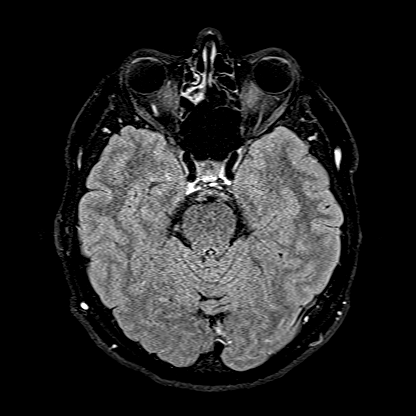
[im 35/53]
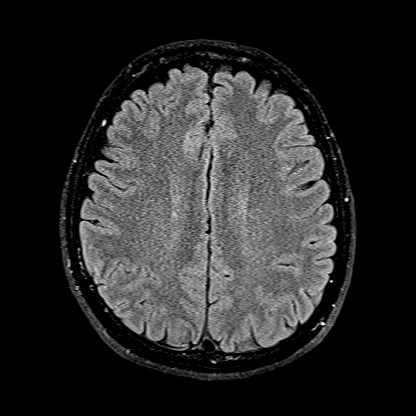
[im 53/53]
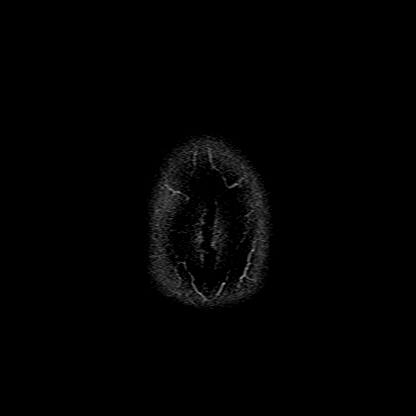

[Series 16: T1 · axial · 1.0mm · 0.98mm/px · z∈[-71,+85]mm · 8 of 160 slices shown (2 of 2)]
[im 1/160]
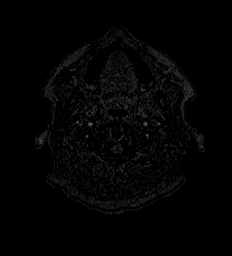
[im 29/160]
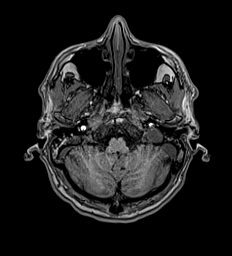
[im 44/160]
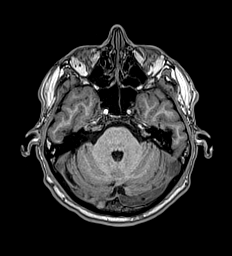
[im 73/160]
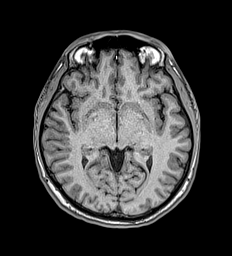
[im 87/160]
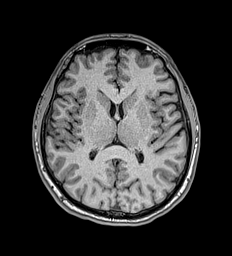
[im 116/160]
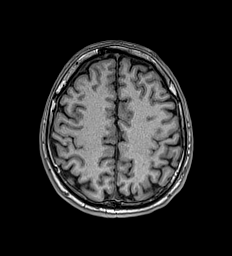
[im 131/160]
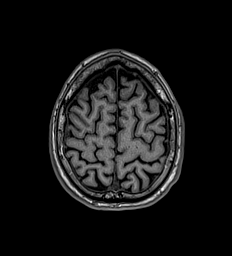
[im 160/160]
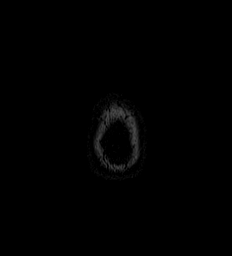

[Series 17: T2 · coronal · 3.0mm · 0.23mm/px · 3 of 35 slices shown (2 of 2)]
[im 1/35]
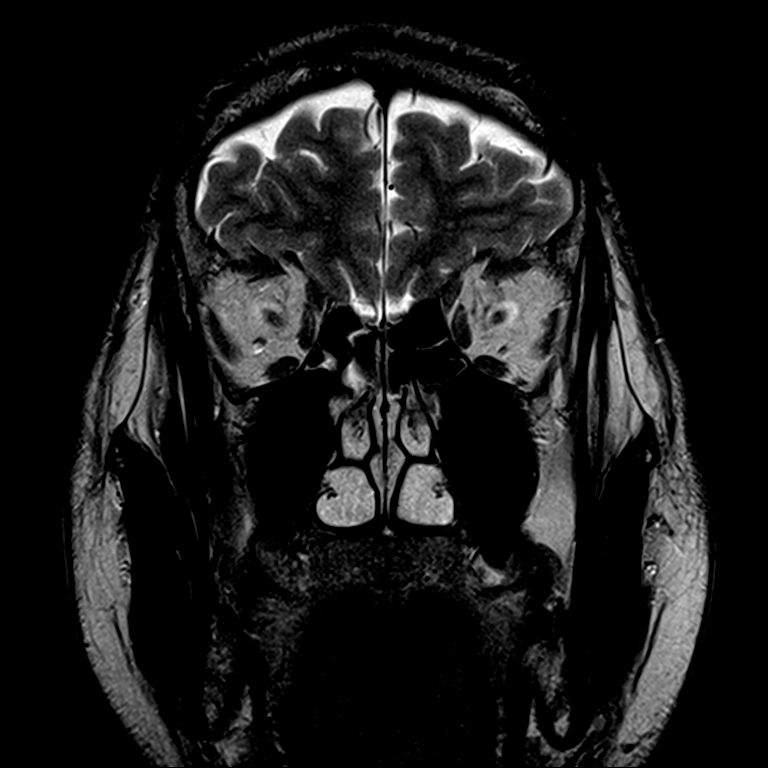
[im 18/35]
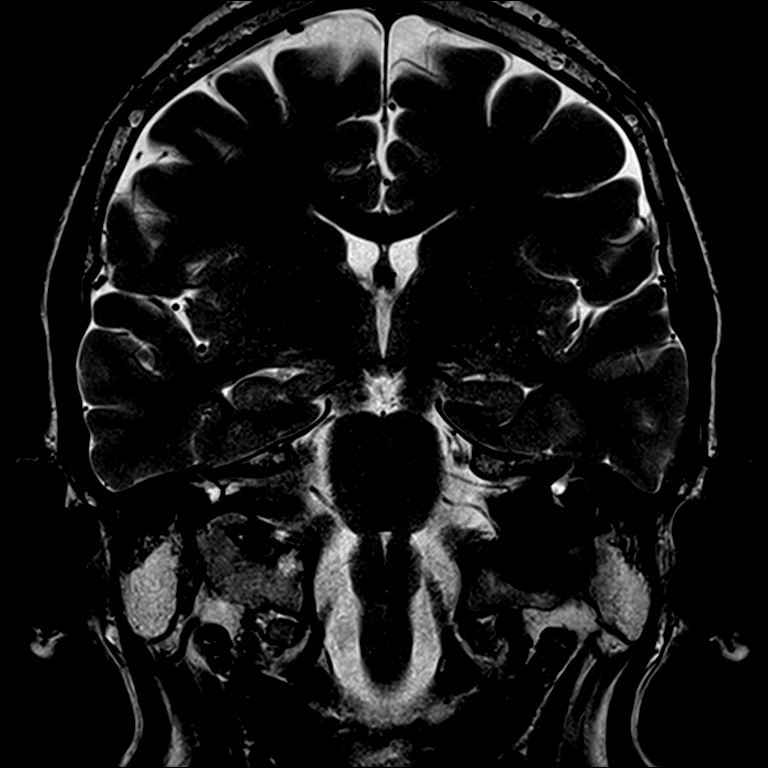
[im 35/35]
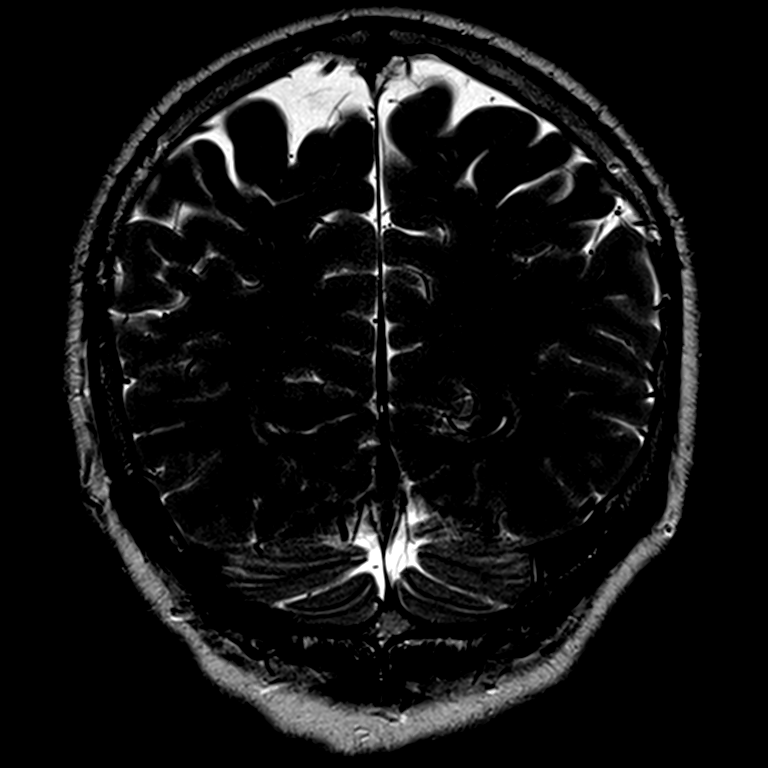

[Series 18: FLAIR · coronal · 3.0mm · 0.35mm/px · 3 of 35 slices shown (2 of 2)]
[im 1/35]
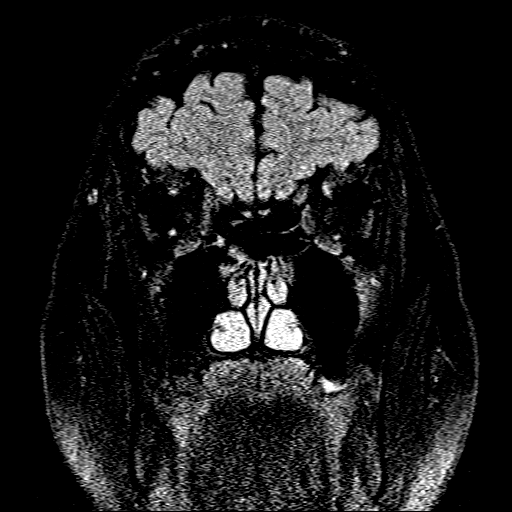
[im 18/35]
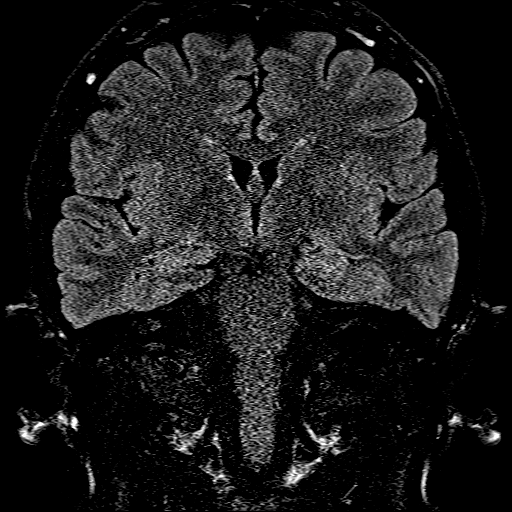
[im 35/35]
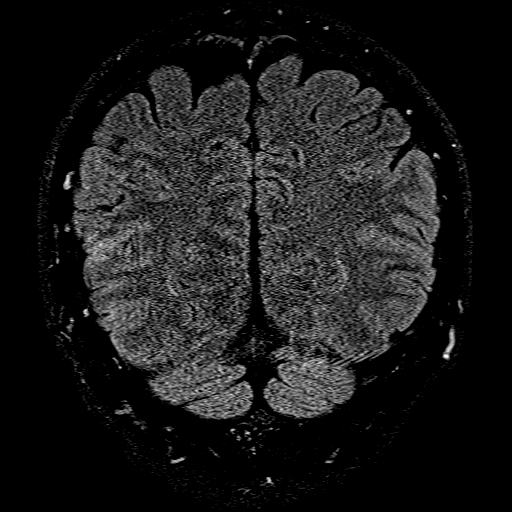

[28 of 48 positions shown; findings below may reference images not displayed]

FINDINGS: BRAIN: No acute infarct, acute hemorrhage or extra-axial collection.
Normal white matter signal. Normal volume of CSF spaces. No chronic
microhemorrhage. Normal midline structures.

VASCULAR: Major flow voids are preserved.

SKULL AND UPPER CERVICAL SPINE: Normal calvarium and skull base.
Visualized upper cervical spine and soft tissues are normal.

SINUSES/ORBITS: No paranasal sinus fluid levels or advanced mucosal
thickening. No mastoid or middle ear effusion. Normal orbits.
IMPRESSION: Normal brain MRI.

## 2019-08-04 MED ORDER — SODIUM CHLORIDE 0.9 % IV SOLN
75.0000 mL/h | INTRAVENOUS | Status: DC
  Administered 2019-08-04: 75 mL/h via INTRAVENOUS

## 2019-08-04 MED ORDER — ENOXAPARIN SODIUM 40 MG/0.4ML ~~LOC~~ SOLN
40.0000 mg | SUBCUTANEOUS | Status: DC
  Administered 2019-08-04: 40 mg via SUBCUTANEOUS
  Filled 2019-08-04: qty 0.4

## 2019-08-04 MED ORDER — ATORVASTATIN CALCIUM 10 MG PO TABS
10.0000 mg | ORAL_TABLET | Freq: Every day | ORAL | Status: DC
  Administered 2019-08-05: 10 mg via ORAL
  Filled 2019-08-04: qty 1

## 2019-08-04 MED ORDER — TRAMADOL HCL 50 MG PO TABS
50.0000 mg | ORAL_TABLET | Freq: Four times a day (QID) | ORAL | Status: DC | PRN
  Administered 2019-08-05 (×2): 50 mg via ORAL
  Filled 2019-08-04 (×2): qty 1

## 2019-08-04 MED ORDER — LEVETIRACETAM IN NACL 500 MG/100ML IV SOLN
500.0000 mg | Freq: Two times a day (BID) | INTRAVENOUS | Status: DC
  Filled 2019-08-04 (×2): qty 100

## 2019-08-04 MED ORDER — ASPIRIN EC 81 MG PO TBEC
81.0000 mg | DELAYED_RELEASE_TABLET | Freq: Every day | ORAL | Status: DC
  Administered 2019-08-05: 81 mg via ORAL
  Filled 2019-08-04: qty 1

## 2019-08-04 MED ORDER — ASPIRIN 81 MG PO CHEW: 81.0000 mg | CHEWABLE_TABLET | Freq: Once | ORAL | Status: DC

## 2019-08-04 MED ORDER — LEVETIRACETAM IN NACL 1000 MG/100ML IV SOLN
1000.0000 mg | Freq: Once | INTRAVENOUS | Status: AC
  Administered 2019-08-04: 1000 mg via INTRAVENOUS
  Filled 2019-08-04: qty 100

## 2019-08-04 MED ORDER — ACETAMINOPHEN 325 MG PO TABS
650.0000 mg | ORAL_TABLET | Freq: Four times a day (QID) | ORAL | Status: DC | PRN
  Filled 2019-08-04: qty 2

## 2019-08-04 MED ORDER — SODIUM CHLORIDE 0.9 % IV SOLN
500.0000 mg | Freq: Two times a day (BID) | INTRAVENOUS | Status: DC
  Administered 2019-08-05: 500 mg via INTRAVENOUS
  Filled 2019-08-04 (×2): qty 5

## 2019-08-04 MED ORDER — LORAZEPAM 2 MG/ML IJ SOLN
1.0000 mg | INTRAMUSCULAR | Status: DC | PRN
  Administered 2019-08-04: 2 mg via INTRAVENOUS
  Filled 2019-08-04: qty 1

## 2019-08-04 MED ORDER — STROKE: EARLY STAGES OF RECOVERY BOOK: Freq: Once | Status: DC

## 2019-08-04 NOTE — Plan of Care (Signed)
  Problem: Education: Goal: Knowledge of General Education information will improve Description: Including pain rating scale, medication(s)/side effects and non-pharmacologic comfort measures Outcome: Progressing Note: Patient presented with left side weakness. Patient ambulated from hallway to the bed. Patient is complaining of a headache. Patient appeared to have a petite mal seizure while completing profile. Administered 1 mg of ativan. No skin issues noted.

## 2019-08-04 NOTE — ED Triage Notes (Addendum)
Patient arrived via EMS from home. Patient at baseline is AOx4 and ambulatory. Patient family member / friend called 911 due to patient having seizure that lasted 10 minutes and 2-3 minutes with EMS which is when 2mg  of intranasal midazolam was given. Patient is now AOx2 but still a little lethargic from seizures.   Patient is having difficulty seeing out of left eye and some left sided weakness which is related to seizure.

## 2019-08-04 NOTE — ED Notes (Signed)
Per MD Fuller Plan, do not give aspirin until CT has resulted.

## 2019-08-04 NOTE — Consult Note (Signed)
[facility] Consult Services   Date of Service:   08/04/2019 18:08:56  Impression:       R56.9 - Seizures  Comments/Sign-Out: 39 year old man, with hx of seizures, who stopped his keppra, and has been having symptoms of blurry vision for 5 days, L arm heaviness for 2 days, and seizures today- NIH 2 for L drift, and confusion., CT head unremarkable. Not in alteplase or intervention window. Diff Dx: seizures- from med non compliance, rule out CVA, metabolic. Rec: - keppra 1000 mg IV now, and continue 500 mg q 12 - EEG - MRI brain - ASA - cva rule out admit - cva orders - Neurology follow up. - seizure precautions All of the above recs d/w ER and pt.  Metrics: Last Known Well: Unknown [facility] Notification Time: 08/04/2019 18:08:34 Arrival Time: 08/04/2019 17:52:00 Stamp Time: 08/04/2019 18:08:56 Time First Login Attempt: 08/04/2019 18:13:00 Symptoms: seizure NIHSS Start Assessment Time: 08/04/2019 18:23:29 Patient is not a candidate for Alteplase/Activase. Alteplase Medical Decision: 08/04/2019 18:23:27 Patient was not deemed candidate for Alteplase/Activase thrombolytics because of following reasons: Last Well Known Above 4.5 Hours.  CT head showed no acute hemorrhage or acute core infarct.  Radiologist was not called back for review of advanced imaging because reviewed ED Physician notified of diagnostic impression and management plan on 08/04/2019 18:34:08  Advanced Imaging: Advanced Imaging Not Recommended because:  L arm weakness 2 days, blurry vision 5 days, not NIR candidate. could be post ictal.   Our recommendations are outlined below.  Recommendations:       Activate Stroke Protocol Admission/Order Set       Stroke/Telemetry Floor       Neuro Checks       Bedside Swallow Eval       DVT Prophylaxis       IV Fluids, Normal Saline       Head of Bed 30 Degrees       Euglycemia and Avoid Hyperthermia (PRN  Acetaminophen)       Antiplatelet Therapy Recommended       - keppra 1000 mg IV now, and continue 500 mg q 12       - EEG       - MRI brain       - ASA       - cva rule out admit       - cva orders       - Neurology follow up.       - seizure precautions  Routine Consultation with Inhouse Neurology for Follow up Care  Sign Out:       Discussed with Emergency Department Provider    ------------------------------------------------------------------------------  History of Present Illness: Patient is a 39 year old Male.  Patient was brought by EMS for symptoms of seizure  39 yr old man, with hx of seizures, anxiety, depression, who is on keppra at home, he stopped it 3 weeks ago, he tells me he was taking total 2500 mg daily. He thought he is getting better, so he stopped his medicine. About 5 days ago, he had blurry vision and a mild headache. 2 days ago, he felt L arm weakness. Today, he had 2 seizures at home, short lasting, GTC seizures, and brought by EMS. He has increased L sided weakness, which started 2 days ago. He denied any headache. He denied diplopia. He is post ictal, but improving now. No seizures now.     NIHSS may not be reliable due to:  slight confusion, post ictal, and L arm drift.  Examination: BP(pending), Pulse(pending), Blood Glucose(89) 1A: Level of Consciousness - Alert; keenly responsive + 0 1B: Ask Month and Age - 1 Question Right + 1 1C: Blink Eyes & Squeeze Hands - Performs Both Tasks + 0 2: Test Horizontal Extraocular Movements - Normal + 0 3: Test Visual Fields - No Visual Loss + 0 4: Test Facial Palsy (Use Grimace if Obtunded) - Normal symmetry + 0 5A: Test Left Arm Motor Drift - Drift, but doesn't hit bed + 1 5B: Test Right Arm Motor Drift - No Drift for 10 Seconds + 0 6A: Test Left Leg Motor Drift - No Drift for 5 Seconds + 0 6B: Test Right Leg Motor Drift - No Drift for 5 Seconds + 0 7: Test Limb Ataxia (FNF/Heel-Shin) - No Ataxia  + 0 8: Test Sensation - Normal; No sensory loss + 0 9: Test Language/Aphasia - Normal; No aphasia + 0 10: Test Dysarthria - Normal + 0 11: Test Extinction/Inattention - No abnormality + 0  NIHSS Score: 2  Pre-Morbid Modified Ranking Scale: 0 Points = No symptoms at all   Patient/Family was informed the Neurology Consult would occur via TeleHealth consult by way of interactive audio and video telecommunications and consented to receiving care in this manner.   Patient is being evaluated for possible acute neurologic impairment and high probability of imminent or life-threatening deterioration. I spent total of 25 minutes providing care to this patient, including time for face to face visit via telemedicine, review of medical records, imaging studies and discussion of findings with providers, the patient and/or family.   Dr Lloyd Huger   [facility] [phone]  Case [id number]
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
## 2019-08-04 NOTE — ED Notes (Signed)
Tele-Neuro and ED MD at patient bedside at this time.
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
## 2019-08-05 ENCOUNTER — Observation Stay
Admit: 2019-08-05 | Discharge: 2019-08-05 | Disposition: A | Discharge status: Home / Self Care | Payer: PRIVATE HEALTH INSURANCE | Attending: Internal Medicine | Admitting: Internal Medicine

## 2019-08-05 ENCOUNTER — Observation Stay: Payer: PRIVATE HEALTH INSURANCE

## 2019-08-05 DIAGNOSIS — G40909 Epilepsy, unspecified, not intractable, without status epilepticus: Secondary | ICD-10-CM

## 2019-08-05 LAB — URINALYSIS, COMPLETE (UACMP) WITH MICROSCOPIC
Bacteria, UA: NONE SEEN
Bilirubin Urine: NEGATIVE
Glucose, UA: NEGATIVE mg/dL
Hgb urine dipstick: NEGATIVE
Ketones, ur: NEGATIVE mg/dL
Leukocytes,Ua: NEGATIVE
Nitrite: NEGATIVE
Protein, ur: NEGATIVE mg/dL
Specific Gravity, Urine: 1.025 (ref 1.005–1.030)
Squamous Epithelial / HPF: NONE SEEN (ref 0–5)
pH: 5 (ref 5.0–8.0)

## 2019-08-05 LAB — HEMOGLOBIN A1C
Hgb A1c MFr Bld: 4.9 % (ref 4.8–5.6)
Mean Plasma Glucose: 93.93 mg/dL

## 2019-08-05 LAB — LIPID PANEL
Cholesterol: 182 mg/dL (ref 0–200)
HDL: 31 mg/dL — ABNORMAL LOW
LDL Cholesterol: 108 mg/dL — ABNORMAL HIGH (ref 0–99)
Total CHOL/HDL Ratio: 5.9 ratio
Triglycerides: 216 mg/dL — ABNORMAL HIGH
VLDL: 43 mg/dL — ABNORMAL HIGH (ref 0–40)

## 2019-08-05 LAB — URINE DRUG SCREEN, QUALITATIVE (ARMC ONLY)
Amphetamines, Ur Screen: NOT DETECTED
Barbiturates, Ur Screen: NOT DETECTED
Benzodiazepine, Ur Scrn: POSITIVE — AB
Cannabinoid 50 Ng, Ur ~~LOC~~: NOT DETECTED
Cocaine Metabolite,Ur ~~LOC~~: NOT DETECTED
MDMA (Ecstasy)Ur Screen: NOT DETECTED
Methadone Scn, Ur: NOT DETECTED
Opiate, Ur Screen: POSITIVE — AB
Phencyclidine (PCP) Ur S: NOT DETECTED
Tricyclic, Ur Screen: POSITIVE — AB

## 2019-08-05 LAB — ECHOCARDIOGRAM COMPLETE
Height: 71 in
Weight: 2737.6 [oz_av]

## 2019-08-05 IMAGING — US US CAROTID DUPLEX BILAT
1 series · 13 of 24 positions shown · non-contrast
Comparison: None.

CLINICAL DATA: 39-year-old male with a history blurred vision

EXAM:
BILATERAL CAROTID DUPLEX ULTRASOUND
TECHNIQUE: Gray scale imaging, color Doppler and duplex ultrasound were
performed of bilateral carotid and vertebral arteries in the neck.

[Series 1: us carotid bilateral · 13 of 66 slices shown]
[im 1/66]
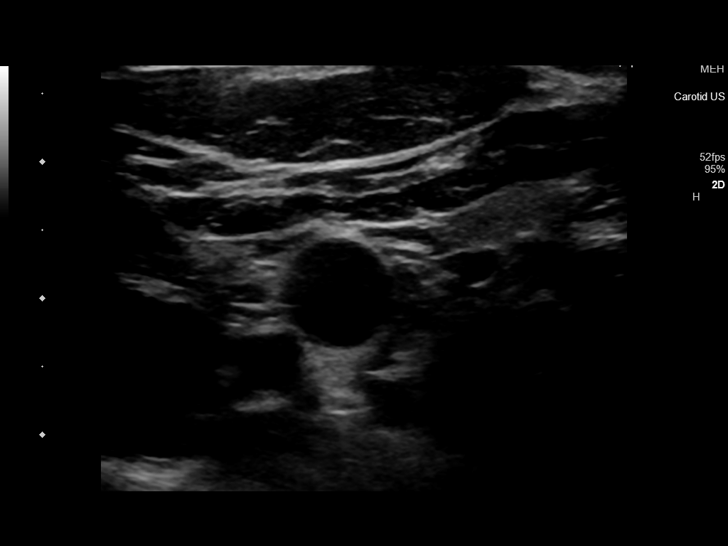
[im 6/66]
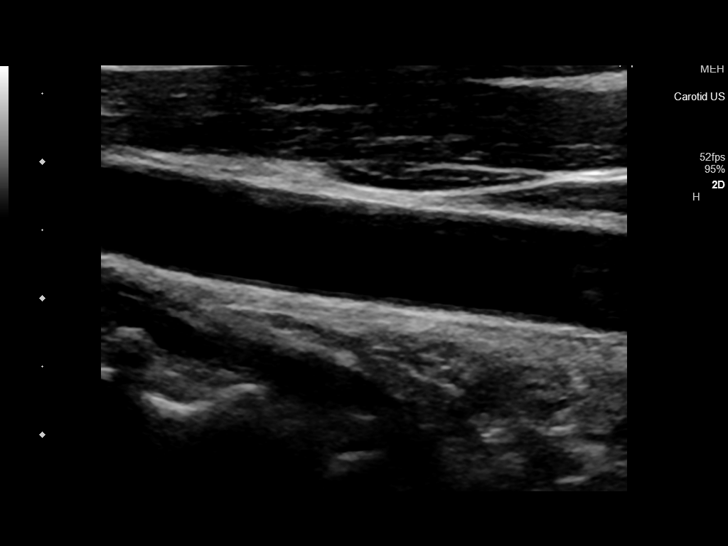
[im 12/66]
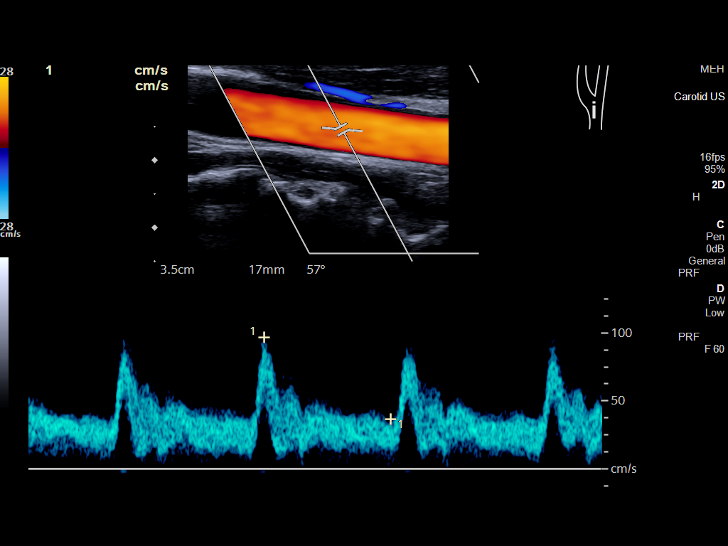
[im 17/66]
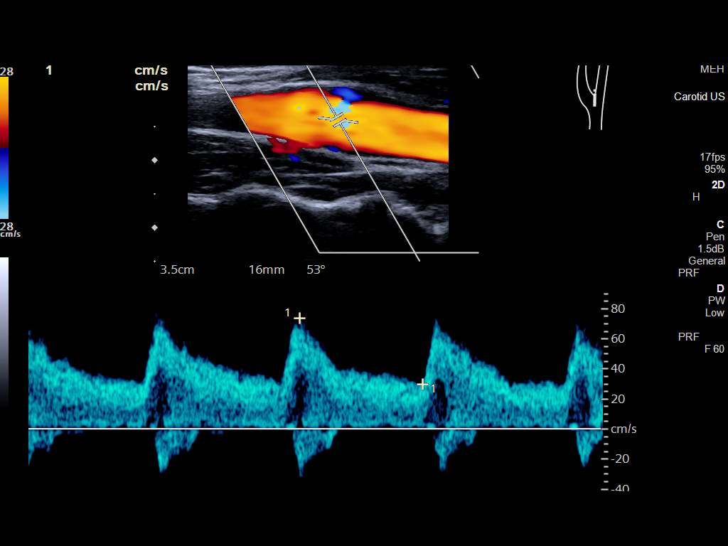
[im 23/66]
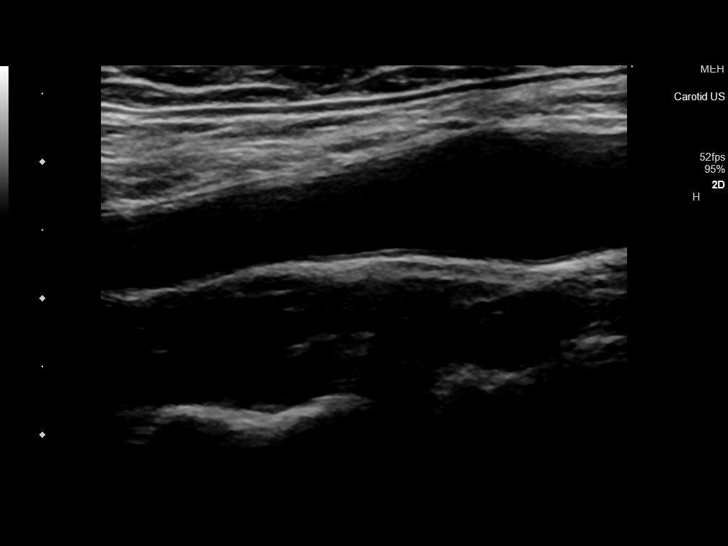
[im 29/66]
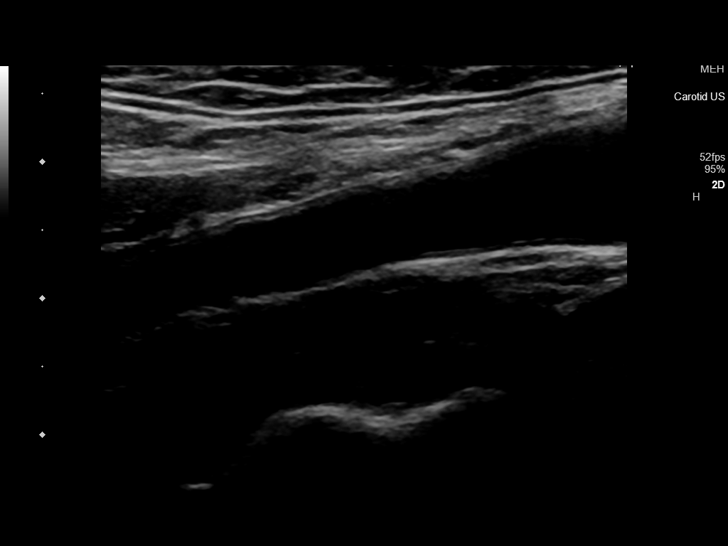
[im 34/66]
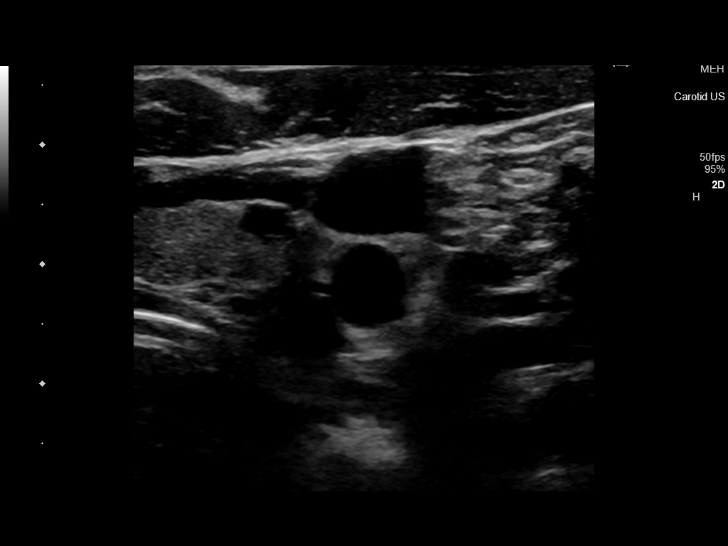
[im 37/66]
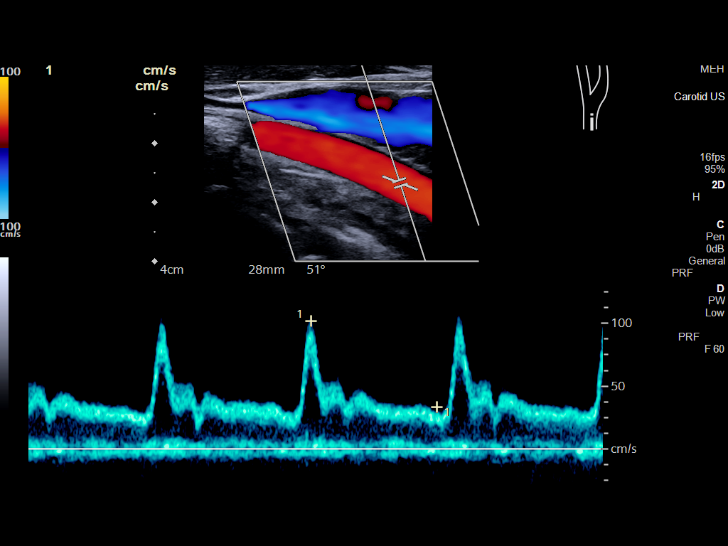
[im 43/66]
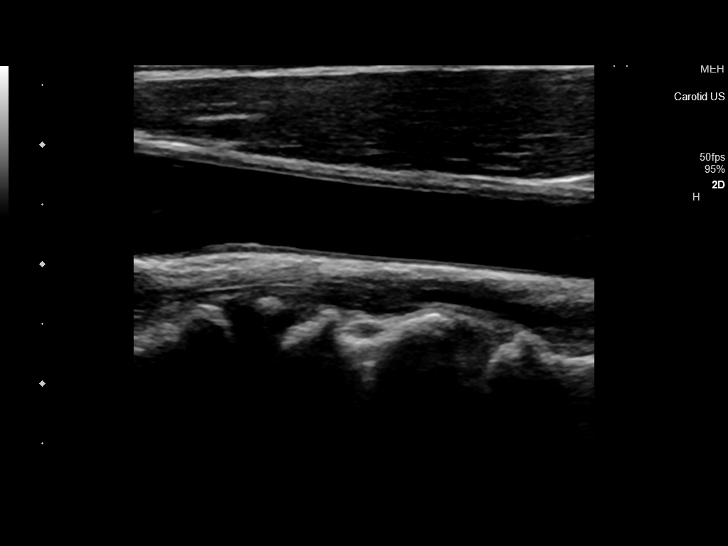
[im 49/66]
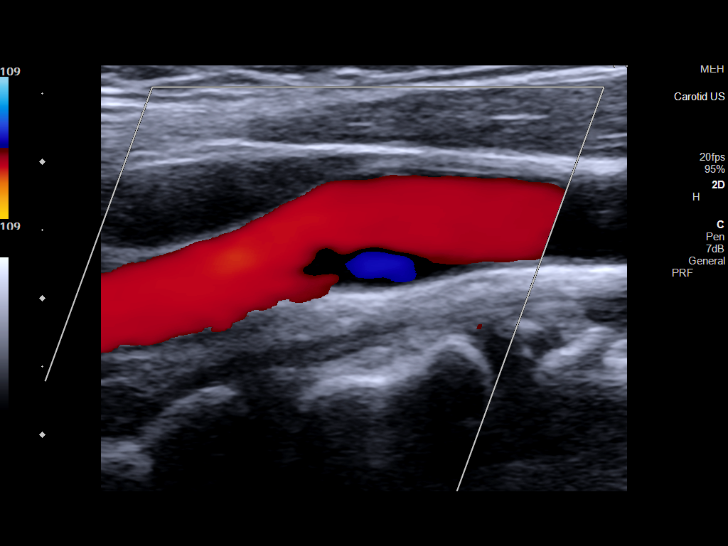
[im 54/66]
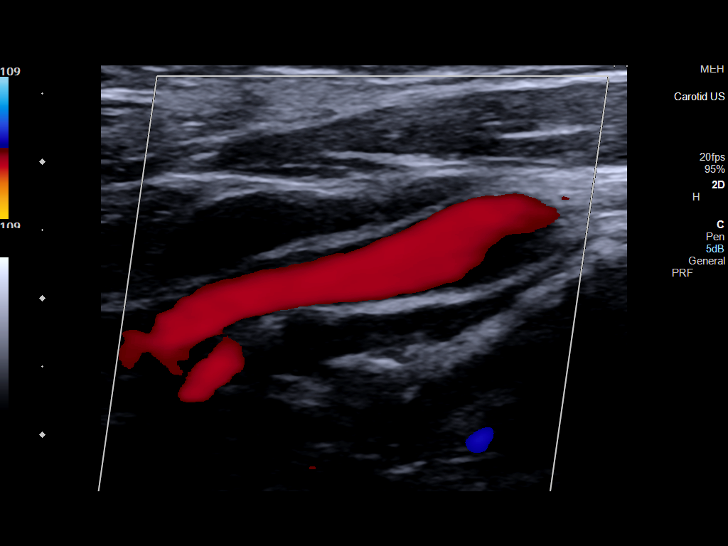
[im 60/66]
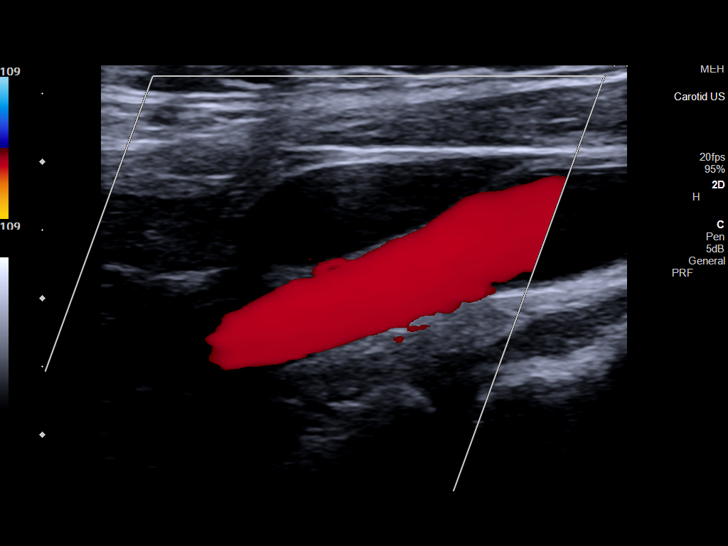
[im 66/66]
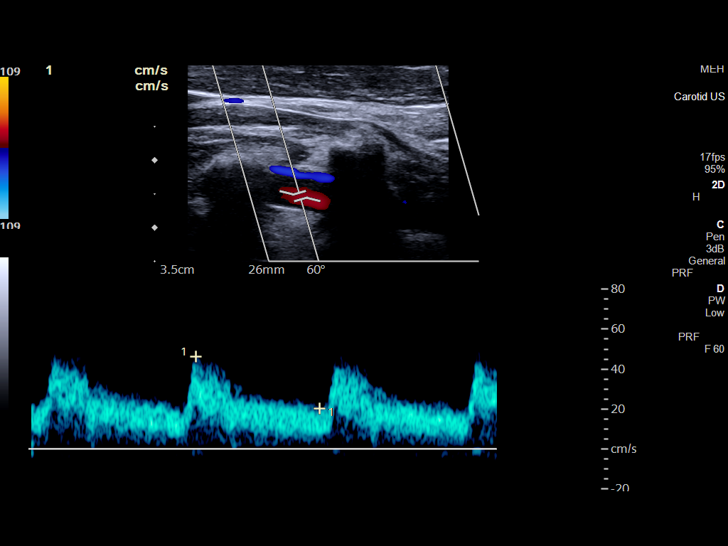

[13 of 24 positions shown; findings below may reference images not displayed]

FINDINGS: Criteria: Quantification of carotid stenosis is based on velocity
parameters that correlate the residual internal carotid diameter
with NASCET-based stenosis levels, using the diameter of the distal
internal carotid lumen as the denominator for stenosis measurement.

The following velocity measurements were obtained:

RIGHT

ICA:  Systolic 76 cm/sec, Diastolic 45 cm/sec

CCA:  97 cm/sec

SYSTOLIC ICA/CCA RATIO:

ECA:  86 cm/sec

LEFT

ICA:  Systolic 83 cm/sec, Diastolic 31 cm/sec

CCA:  89 cm/sec

SYSTOLIC ICA/CCA RATIO:

ECA:  65 cm/sec

Right Brachial SBP: Not acquired

Left Brachial SBP: Not acquired

RIGHT CAROTID ARTERY: No significant calcified disease of the right
common carotid artery. Intermediate waveform maintained. Homogeneous
plaque without significant calcifications at the right carotid
bifurcation. Low resistance waveform of the right ICA. No
significant tortuosity.

RIGHT VERTEBRAL ARTERY: Antegrade flow with low resistance waveform.

LEFT CAROTID ARTERY: No significant calcified disease of the left
common carotid artery. Intermediate waveform maintained. Homogeneous
plaque at the left carotid bifurcation without significant
calcifications. Low resistance waveform of the left ICA.

LEFT VERTEBRAL ARTERY:  Antegrade flow with low resistance waveform.
IMPRESSION: Color duplex indicates minimal homogeneous plaque, with no
hemodynamically significant stenosis by duplex criteria in the
extracranial cerebrovascular circulation.

## 2019-08-05 MED ORDER — LEVETIRACETAM 1000 MG PO TABS: 1000.0000 mg | ORAL_TABLET | Freq: Two times a day (BID) | ORAL | 1 refills | Status: DC

## 2019-08-05 MED ORDER — LEVETIRACETAM 500 MG PO TABS: 500.0000 mg | ORAL_TABLET | Freq: Two times a day (BID) | ORAL | Status: DC

## 2019-08-05 MED ORDER — PRAVASTATIN SODIUM 20 MG PO TABS: 20.0000 mg | ORAL_TABLET | Freq: Every evening | ORAL | 11 refills | Status: DC

## 2019-08-05 MED ORDER — LEVETIRACETAM IN NACL 500 MG/100ML IV SOLN
500.0000 mg | Freq: Two times a day (BID) | INTRAVENOUS | Status: DC
  Filled 2019-08-05: qty 100

## 2019-08-05 MED ORDER — ASPIRIN 81 MG PO TBEC: 81.0000 mg | DELAYED_RELEASE_TABLET | Freq: Every day | ORAL | 11 refills | Status: DC

## 2019-08-05 NOTE — Progress Notes (Signed)
Pt had completed AD already

## 2019-08-05 NOTE — Procedures (Signed)
Patient Name: ARISTIDIS TALERICO  MRN: [id number]  Epilepsy Attending: Charlsie Quest  Referring Physician/Provider: Dr. Lindajo Royal Date: 08/05/2019 Duration: 23.09 minutes  Patient history: 39 year old male with history of epilepsy presented with breakthrough seizure in the setting of medication noncompliance.  EEG evaluate for seizures.  Level of alertness: Awake, asleep  AEDs during EEG study: Keppra  Technical aspects: This EEG study was done with scalp electrodes positioned according to the 10-20 International system of electrode placement. Electrical activity was acquired at a sampling rate of 500Hz  and reviewed with a high frequency filter of 70Hz  and a low frequency filter of 1Hz . EEG data were recorded continuously and digitally stored.   Description: The posterior dominant rhythm consists of 9 Hz activity of moderate voltage (25-35 uV) seen predominantly in posterior head regions, symmetric and reactive to eye opening and eye closing. Sleep was characterized by vertex waves, sleep spindles (12 to 14 Hz), maximal frontocentral region.  EEG showed intermittent left and right temporal 5-6 Hz theta slowing. Physiology photic driving was seen during photic stimulation.  Hyperventilation was not performed.     ABNORMALITY -Intermittent slow, left and right temporal region  IMPRESSION: This study is suggestive of nonspecific cortical dysfunction in left and right temporal region.  No seizures or epileptiform discharges were seen throughout the recording.   Temara Lanum 

## 2019-08-05 NOTE — Progress Notes (Signed)
*  PRELIMINARY RESULTS* Echocardiogram 2D Echocardiogram has been performed.  Cristela Blue 08/05/2019, 9:18 AM

## 2019-08-05 NOTE — Plan of Care (Signed)

## 2019-08-05 NOTE — Progress Notes (Signed)
*  PRELIMINARY RESULTS* Echocardiogram 2D Echocardiogram has been performed.  Cristela Blue 08/05/2019, 9:16 AM

## 2019-08-05 NOTE — Discharge Summary (Signed)
Physician Discharge Summary  Nicholas Reese:[id number] DOB: Mar 09, 1980 DOA: 08/04/2019  PCP: [facility]  Admit date: 08/04/2019 Discharge date: 08/05/2019 Consultations: Tele Neurology Admitted From: home Disposition: home  Discharge Diagnoses:  Principal Problem:   Recurrent seizures (HCC) Active Problems:   Seizure disorder (HCC)   Acute focal neurologic deficit with partial resolution   Acute focal neurological deficit   Hospital Course Summary: 39 y.o.malewith seizure disorder and depression, who ran out of his seizure medication 3 weeks prior who presented by EMS following a seizure at home. This was followed by another seizure in the EMS vehicle - received intranasal midazolam. Patient was postictal upon arrival. Reportedly,on June 9 started having left sided weakness and on June 11 started having difficulty seeing out of the left. Still feels like he has the deficits but they have improved. ED Course: Afebrile, slightly drowsy but oriented x3. Vitals were within normal limits. Blood work mostly unremarkable though acetaminophen level was slightly elevated at 65. Salicylate level less than 7. CT head and C-spine showed no acute disease.Was loaded with Keppra 1000 mg IV in the emergency room. EDP spoke with teleneurology, who recommended observation admit for EEG and MRI. Hospitalist consulted for admission. Hospital course: Patient admitted to [facility] for break through seizures with Keppra 500 mg bid and for stroke w/u.  Breakthrough seizure activity : in the setting of underlying seizure disorder and poor compliance with medication. Patient admitted following a witnessed seizure at home and another one in the EMS vehicle after reportedly ran out of medication 3 weeks prior. Was loaded with Keppra 1000 mg IV in the emergency room. Admitted with Keppra 500 IV twice daily and Ativan as needed. He is doing well today. He may resume prior home dose of Keppra 1 gm  BID .Patient seen by pharmacy/social worker-- medication assistance and PCP being arranged.  No driving until seizure free for 4-6 months and cleared by PCP .   Left hemiparesis: TIA vsTodd's paralysis-Patient reported neurologic deficits included left-sided weakness of upper and lower extremities as well as blurred vision in the left eye starting at least 3 days prior to arrival also outside of TPA window. CT head/MRI /Carotid usg showed no acute findings. Echo mild LVF, preserved EF, stage 1 diastolic dysfunction. 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 Patient close to baseline now . Seen by PT/OT who recommended outpatient PT/OT referral. Lipid profile with low HDL at 31, LDL at 108, VLDL 43, TG 216. Recommend baby asa and low dose statins for possible TIA and f/u PCP, consider outpatient neurology referral.    Depression: Resume Paxil and trazodone   Discharge Exam:   Vitals:   08/05/19 0350 08/05/19 0610 08/05/19 0750 08/05/19 1156  BP: 101/73 107/72 105/73 115/81  Pulse: 69 70 80 89  Resp: Temp: 97.6 F (36.4 C)  98 F (36.7 C)   TempSrc: Oral  Oral   SpO2: 99% 100% 99% 99%  Weight: 77.6 kg     Height:        General: Pt is alert, awake, not in acute distress Cardiovascular: RRR, S1/S2 +, no rubs, no gallops Respiratory: CTA bilaterally, no wheezing, no rhonchi Abdominal: Soft, NT, ND, bowel sounds + Extremities: no edema, no cyanosis. Resolving left sided weakness  Discharge Condition:Stable CODE STATUS: Full code Diet recommendation: low fat , low salt Recommendations for Outpatient Follow-up:  1.  Follow up with PCP: 7-10 days 2. Follow up with consultants: Neurology as needed through PCP 3. Please do not drive, avoid operating heavy machinery, swimming or other hazardous activities until seizure free for few months and cleared by PCP.   Home Health services upon discharge:   Outpatient PT referral Equipment/Devices upon discharge:   Discharge Instructions:  Discharge Instructions    Call MD for:   Complete by: As directed    Recurrent seizure activity   Call MD for:  difficulty breathing, headache or visual disturbances   Complete by: As directed    Call MD for:  extreme fatigue   Complete by: As directed    Call MD for:  persistant dizziness or light-headedness   Complete by: As directed    Call MD for:  severe uncontrolled pain   Complete by: As directed    Call MD for:  temperature >100.4   Complete by: As directed    Diet - low sodium heart healthy   Complete by: As directed    Increase activity slowly   Complete by: As directed      Allergies as of 08/05/2019      Reactions   Percocet [oxycodone-acetaminophen] Other (See Comments)   dizzy      Medication List    TAKE these medications   aspirin 81 MG EC tablet Take 1 tablet (81 mg total) by mouth daily. Swallow whole. Start taking on: August 06, 2019   levETIRAcetam 1000 MG tablet Commonly known as: KEPPRA Take 1 tablet (1,000 mg total) by mouth 2 (two) times daily.   PARoxetine 20 MG tablet Commonly known as: PAXIL Take 1 tablet (20 mg total) by mouth daily.   pravastatin 20 MG tablet Commonly known as: Pravachol Take 1 tablet (20 mg total) by mouth every evening.   traZODone 50 MG tablet Commonly known as: DESYREL Take 1 tablet (50 mg total) by mouth at bedtime as needed and may repeat dose one time if needed for sleep.       Allergies  Allergen Reactions  . Percocet [Oxycodone-Acetaminophen] Other (See Comments)    dizzy      The results of significant diagnostics from this hospitalization (including imaging, microbiology, ancillary and laboratory) are listed below for reference.    Labs: BNP (last 3 results) No results for input(s): BNP in the last 8760 hours. Basic Metabolic Panel: Recent Labs  Lab 08/04/19 1756  NA 138  K 3.7  CL 108  CO2 23  GLUCOSE  89  BUN 12  CREATININE 1.14  CALCIUM 9.0   Liver Function Tests: Recent Labs  Lab 08/04/19 1756  AST 16  ALT 13  ALKPHOS 60  BILITOT 0.7  PROT 7.4  ALBUMIN 4.4   No results for input(s): LIPASE, AMYLASE in the last 168 hours. No results for input(s): AMMONIA in the last 168 hours. CBC: Recent Labs  Lab 08/04/19 1756  WBC 6.2  NEUTROABS 3.3  HGB 14.2  HCT 41.3  MCV 96.5  PLT 272   Cardiac Enzymes: No results for input(s): CKTOTAL, CKMB, CKMBINDEX, TROPONINI in the last 168 hours. BNP: Invalid input(s): POCBNP CBG: Recent Labs  Lab 08/04/19 1818  GLUCAP 71   D-Dimer No results for input(s): DDIMER in the last 72 hours. Hgb A1c Recent Labs    08/05/19 0407  HGBA1C 4.9   Lipid Profile Recent Labs    08/05/19 0407  CHOL 182  HDL 31*  LDLCALC 108*  TRIG 216*  CHOLHDL 5.9  Thyroid function studies No results for input(s): TSH, T4TOTAL, T3FREE, THYROIDAB in the last 72 hours.  Invalid input(s): FREET3 Anemia work up No results for input(s): VITAMINB12, FOLATE, FERRITIN, TIBC, IRON, RETICCTPCT in the last 72 hours. Urinalysis    Component Value Date/Time   COLORURINE YELLOW (A) 08/05/2019 0300   APPEARANCEUR CLEAR (A) 08/05/2019 0300   APPEARANCEUR Clear 08/22/2012 0757   LABSPEC 1.025 08/05/2019 0300   LABSPEC 1.017 08/22/2012 0757   PHURINE 5.0 08/05/2019 0300   GLUCOSEU NEGATIVE 08/05/2019 0300   GLUCOSEU Negative 08/22/2012 0757   HGBUR NEGATIVE 08/05/2019 0300   BILIRUBINUR NEGATIVE 08/05/2019 0300   BILIRUBINUR Negative 08/22/2012 0757   KETONESUR NEGATIVE 08/05/2019 0300   PROTEINUR NEGATIVE 08/05/2019 0300   NITRITE NEGATIVE 08/05/2019 0300   LEUKOCYTESUR NEGATIVE 08/05/2019 0300   LEUKOCYTESUR Negative 08/22/2012 0757   Sepsis Labs Invalid input(s): PROCALCITONIN,  WBC,  LACTICIDVEN Microbiology Recent Results (from the past 240 hour(s))  SARS Coronavirus 2 by RT PCR (hospital order, performed in [place] hospital lab)  Nasopharyngeal Nasopharyngeal Swab     Status: None   Collection Time: 08/04/19  7:29 PM   Specimen: Nasopharyngeal Swab  Result Value Ref Range Status   SARS Coronavirus 2 NEGATIVE NEGATIVE Final    Comment: (NOTE) SARS-CoV-2 target nucleic acids are NOT DETECTED.  The SARS-CoV-2 RNA is generally detectable in upper and lower respiratory specimens during the acute phase of infection. The lowest concentration of SARS-CoV-2 viral copies this assay can detect is 250 copies / mL. A negative result does not preclude SARS-CoV-2 infection and should not be used as the sole basis for treatment or other patient management decisions.  A negative result may occur with improper specimen collection / handling, submission of specimen other than nasopharyngeal swab, presence of viral mutation(s) within the areas targeted by this assay, and inadequate number of viral copies (<250 copies / mL). A negative result must be combined with clinical observations, patient history, and epidemiological information.  Fact Sheet for Patients:   StrictlyIdeas.no  Fact Sheet for Healthcare Providers: BankingDealers.co.za  This test is not yet approved or  cleared by the [place] FDA and has been authorized for detection and/or diagnosis of SARS-CoV-2 by FDA under an Emergency Use Authorization (EUA).  This EUA will remain in effect (meaning this test can be used) for the duration of the COVID-19 declaration under Section 564(b)(1) of the Act, 21 U.S.C. section 360bbb-3(b)(1), unless the authorization is terminated or revoked sooner.  Performed at [facility], [address] [place], [place] [address]     Procedures/Studies: CT Head Wo Contrast  Result Date: 08/04/2019 CLINICAL DATA:  Seizure without abnormal neuro exam. History of seizures. EXAM: CT HEAD WITHOUT CONTRAST TECHNIQUE: Contiguous axial images were obtained from the base of the  skull through the vertex without intravenous contrast. COMPARISON:  Head CT 08/13/2018. FINDINGS: Brain: No intracranial hemorrhage, mass effect, or midline shift. No hydrocephalus. The basilar cisterns are patent. No evidence of territorial infarct or acute ischemia. No extra-axial or intracranial fluid collection. Vascular: No hyperdense vessel or unexpected calcification. Skull: No fracture or focal lesion. Sinuses/Orbits: Paranasal sinuses and mastoid air cells are clear. The visualized orbits are unremarkable. Other: None. IMPRESSION: Negative noncontrast head CT. Electronically Signed   By: Keith Rake M.D.   On: 08/04/2019 18:58   CT Cervical Spine Wo Contrast  Result Date: 08/04/2019 CLINICAL DATA:  Neck trauma, uncomplicated (NEXUS/CCR neg) (Age 62-64y) Seizure. EXAM: CT CERVICAL SPINE WITHOUT CONTRAST TECHNIQUE: Multidetector  CT imaging of the cervical spine was performed without intravenous contrast. Multiplanar CT image reconstructions were also generated. COMPARISON:  Cervical spine CT 08/13/2018 FINDINGS: Alignment: Normal. Skull base and vertebrae: No acute fracture. Vertebral body heights are maintained. The dens and skull base are intact. Soft tissues and spinal canal: No prevertebral fluid or swelling. No visible canal hematoma. Disc levels: Mild disc space narrowing and endplate spurring at C5-C6 and C6-C7, stable from prior. Upper chest: Negative. Other: Poor dentition is partially included. IMPRESSION: Mild degenerative change in the cervical spine without acute fracture or subluxation. Electronically Signed   By: Narda Rutherford M.D.   On: 08/04/2019 19:01   MR BRAIN WO CONTRAST  Result Date: 08/04/2019 CLINICAL DATA:  Seizure EXAM: MRI HEAD WITHOUT CONTRAST TECHNIQUE: Multiplanar, multiecho pulse sequences of the brain and surrounding structures were obtained without intravenous contrast. COMPARISON:  None. FINDINGS: BRAIN: No acute infarct, acute hemorrhage or extra-axial  collection. Normal white matter signal. Normal volume of CSF spaces. No chronic microhemorrhage. Normal midline structures. VASCULAR: Major flow voids are preserved. SKULL AND UPPER CERVICAL SPINE: Normal calvarium and skull base. Visualized upper cervical spine and soft tissues are normal. SINUSES/ORBITS: No paranasal sinus fluid levels or advanced mucosal thickening. No mastoid or middle ear effusion. Normal orbits. IMPRESSION: Normal brain MRI. Electronically Signed   By: Deatra Robinson M.D.   On: 08/04/2019 21:12   US Carotid Bilateral (at [facility] and [facility] only)  Result Date: 08/05/2019 CLINICAL DATA:  39 year old male with a history blurred vision EXAM: BILATERAL CAROTID DUPLEX ULTRASOUND TECHNIQUE: Wallace Cullens scale imaging, color Doppler and duplex ultrasound were performed of bilateral carotid and vertebral arteries in the neck. COMPARISON:  None. FINDINGS: Criteria: Quantification of carotid stenosis is based on velocity parameters that correlate the residual internal carotid diameter with NASCET-based stenosis levels, using the diameter of the distal internal carotid lumen as the denominator for stenosis measurement. The following velocity measurements were obtained: RIGHT ICA:  Systolic 76 cm/sec, Diastolic 45 cm/sec CCA:  97 cm/sec SYSTOLIC ICA/CCA RATIO:  0.8 ECA:  86 cm/sec LEFT ICA:  Systolic 83 cm/sec, Diastolic 31 cm/sec CCA:  89 cm/sec SYSTOLIC ICA/CCA RATIO:  1.0 ECA:  65 cm/sec Right Brachial SBP: Not acquired Left Brachial SBP: Not acquired RIGHT CAROTID ARTERY: No significant calcified disease of the right common carotid artery. Intermediate waveform maintained. Homogeneous plaque without significant calcifications at the right carotid bifurcation. Low resistance waveform of the right ICA. No significant tortuosity. RIGHT VERTEBRAL ARTERY: Antegrade flow with low resistance waveform. LEFT CAROTID ARTERY: No significant calcified disease of the left common carotid artery. Intermediate waveform  maintained. Homogeneous plaque at the left carotid bifurcation without significant calcifications. Low resistance waveform of the left ICA. LEFT VERTEBRAL ARTERY:  Antegrade flow with low resistance waveform. IMPRESSION: Color duplex indicates minimal homogeneous plaque, with no hemodynamically significant stenosis by duplex criteria in the extracranial cerebrovascular circulation. Signed, Yvone Neu. Reyne Dumas, RPVI [facility] Electronically Signed   By: Gilmer Mor D.O.   On: 08/05/2019 12:35   ECHOCARDIOGRAM COMPLETE  Result Date: 08/05/2019    ECHOCARDIOGRAM REPORT   Patient Name:   Nicholas Reese Date of Exam: 08/05/2019 Medical Rec #:  [id number]          Height:       71.0 in Accession #:    [id number]         Weight:       171.1 lb Date of Birth:  Mar 17, 1980  BSA:          1.973 m Patient Age:    39 years           BP:           105/73 mmHg Patient Gender: M                  HR:           80 bpm. Exam Location:  [facility] Procedure: 2D Echo, Cardiac Doppler and Color Doppler Indications:     Stroke 434.91  History:         Patient has no prior history of Echocardiogram examinations. No                  heart history listed in chart.  Sonographer:     Cristela Blue RDCS (AE) Referring Phys:  [id number] Andris Baumann Diagnosing Phys: Harold Hedge MD IMPRESSIONS  1. Left ventricular ejection fraction, by estimation, is 60 to 65%. Left ventricular ejection fraction by PLAX is 64 %. The left ventricle has normal function. The left ventricle has no regional wall motion abnormalities. There is mild left ventricular hypertrophy. Left ventricular diastolic parameters are consistent with Grade I diastolic dysfunction (impaired relaxation).  2. Right ventricular systolic function is normal. The right ventricular size is mildly enlarged. There is normal pulmonary artery systolic pressure.  3. Right atrial size was mildly dilated.  4. The mitral valve is  grossly normal. Trivial mitral valve regurgitation.  5. The aortic valve is tricuspid. Aortic valve regurgitation is not visualized. No aortic stenosis is present. FINDINGS  Left Ventricle: Left ventricular ejection fraction, by estimation, is 60 to 65%. Left ventricular ejection fraction by PLAX is 64 %. The left ventricle has normal function. The left ventricle has no regional wall motion abnormalities. The left ventricular internal cavity size was normal in size. There is mild left ventricular hypertrophy. Left ventricular diastolic parameters are consistent with Grade I diastolic dysfunction (impaired relaxation). Right Ventricle: The right ventricular size is mildly enlarged. No increase in right ventricular wall thickness. Right ventricular systolic function is normal. There is normal pulmonary artery systolic pressure. The tricuspid regurgitant velocity is 1.90  m/s, and with an assumed right atrial pressure of 10 mmHg, the estimated right ventricular systolic pressure is 24.4 mmHg. Left Atrium: Left atrial size was normal in size. Right Atrium: Right atrial size was mildly dilated. Pericardium: There is no evidence of pericardial effusion. Mitral Valve: The mitral valve is grossly normal. Trivial mitral valve regurgitation. Tricuspid Valve: The tricuspid valve is grossly normal. Tricuspid valve regurgitation is trivial. Aortic Valve: The aortic valve is tricuspid. Aortic valve regurgitation is not visualized. No aortic stenosis is present. Aortic valve mean gradient measures 1.5 mmHg. Aortic valve peak gradient measures 2.7 mmHg. Aortic valve area, by VTI measures 3.84 cm. Pulmonic Valve: The pulmonic valve was not well visualized. Pulmonic valve regurgitation is trivial. Aorta: The aortic root is normal in size and structure. IAS/Shunts: The interatrial septum was not assessed.  LEFT VENTRICLE PLAX 2D LV EF:         Left            Diastology                ventricular     LV e' lateral:   9.68 cm/s                 ejection        LV E/e' lateral:  5.8                fraction by     LV e' medial:    5.55 cm/s                PLAX is 64      LV E/e' medial:  10.1                %. LVIDd:         4.27 cm LVIDs:         2.81 cm LV PW:         1.16 cm LV IVS:        1.27 cm LVOT diam:     2.10 cm LV SV:         47 LV SV Index:   24 LVOT Area:     3.46 cm  RIGHT VENTRICLE RV Basal diam:  4.00 cm RV S prime:     11.50 cm/s TAPSE (M-mode): 3.0 cm LEFT ATRIUM             Index       RIGHT ATRIUM           Index LA diam:        2.70 cm 1.37 cm/m  RA Area:     14.40 cm LA Vol (A2C):   30.4 ml 15.41 ml/m RA Volume:   38.00 ml  19.26 ml/m LA Vol (A4C):   14.4 ml 7.30 ml/m LA Biplane Vol: 21.1 ml 10.69 ml/m  AORTIC VALVE                   PULMONIC VALVE AV Area (Vmax):    2.97 cm    PV Vmax:        0.78 m/s AV Area (Vmean):   3.04 cm    PV Peak grad:   2.4 mmHg AV Area (VTI):     3.84 cm    RVOT Peak grad: 2 mmHg AV Vmax:           81.80 cm/s AV Vmean:          56.250 cm/s AV VTI:            0.124 m AV Peak Grad:      2.7 mmHg AV Mean Grad:      1.5 mmHg LVOT Vmax:         70.20 cm/s LVOT Vmean:        49.300 cm/s LVOT VTI:          0.137 m LVOT/AV VTI ratio: 1.11  AORTA Ao Root diam: 3.83 cm MITRAL VALVE               TRICUSPID VALVE MV Area (PHT): 3.53 cm    TR Peak grad:   14.4 mmHg MV Decel Time: 215 msec    TR Vmax:        190.00 cm/s MV E velocity: 56.30 cm/s MV A velocity: 78.10 cm/s  SHUNTS MV E/A ratio:  0.72        Systemic VTI:  0.14 m                            Systemic Diam: 2.10 cm Harold HedgeKenneth Fath MD Electronically signed by Harold HedgeKenneth Fath MD Signature Date/Time: 08/05/2019/12:51:09 PM    Final     Time coordinating discharge: Over 30 minutes  SIGNED:   Alessandra BevelsNeelima Deonna Krummel, MD  [facility] 08/05/2019, 2:11 PM

## 2019-08-05 NOTE — Clinical Social Work Note (Addendum)
Good RX coupon given to assist with Seizure medications. Oupatient PT/OT referral made. Referral made to [facility] for follow up apt. They will reach out to pt.  [place], [place] [phone]

## 2019-08-05 NOTE — Progress Notes (Signed)
SLP Cancellation Note  Patient Details Name: Nicholas Reese MRN: [id number] DOB: [birth year]   Cancelled treatment:       Reason Eval/Treat Not Completed: SLP screened, no needs identified, will sign off (chart reviewed; consulted NSG then met w/ pt). Pt denied any difficulty swallowing and is currently on a regular diet; tolerates swallowing pills w/ water per NSG. Pt conversed at conversational level w/out deficits noted; pt and NSG denied any speech-language deficits. Pt stated he was talking on the phone w/ his Wife this morning w/out deficits noted by himself, or her. No further skilled ST services indicated as pt appears at his baseline. Pt agreed. NSG to reconsult if any change in status.     Orinda Kenner, MS, CCC-SLP [name] 08/05/2019, 11:52 AM

## 2019-08-05 NOTE — Progress Notes (Signed)
CH attempted visit per OR for AD education and completion; RN mentioned pt. recently admitted and has young kids at home --> wants to make sure 'things are taken care of'.  When [facility] entered pt.'s rm. lights on and TV on but pt. wrapped in sheets asleep.  CH will make referral to AM chaplains.    08/04/19 2220  Clinical Encounter Type  Visited With Patient not available
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
## 2019-08-05 NOTE — Evaluation (Signed)
Physical Therapy Evaluation Patient Details Name: Nicholas Reese MRN: [id number] DOB: 20-Jul-1980 Today's Date: 08/05/2019   History of Present Illness  39 y.o. male with PMHx significant for seizure disorder and depression, who ran out of his seizure medication 3 weeks prior who presented by EMS following a seizure at home, followed by another seizure in the EMS vehicle he received intranasal midazolam with EMS.  Patient was postictal upon arrival but stated that for the past several days,.  On June 9 and started having weakness in the left upper and lower extremity and on June 11 started having difficulty seeing out of the left.  Still feels like he has the deficits but they have improved. Workup neg for acute infarct.  Clinical Impression  Pt is a pleasant 39 year old male who was admitted for recurrent seizures. Per chart review, he ran out of meds and started having symptoms. Pt performs bed mobility with independence, transfers with cga, and ambulation with min assist and HHA. Pt is very high falls risk and needed hands on assist to prevent LOB and fall. Educated about fall prevention and possible use of AD including RW. Will attempt further ambulation next session with AD. Pt demonstrates deficits with L side strength/coordination/sensation/balance/strength. Would benefit from skilled PT to address above deficits and promote optimal return to PLOF. Recommend transition to [place] upon discharge from acute hospitalization.     Follow Up Recommendations Home health PT;Supervision/Assistance - 24 hour    Equipment Recommendations  Rolling walker with 5" wheels    Recommendations for Other Services       Precautions / Restrictions Precautions Precautions: Fall Restrictions Weight Bearing Restrictions: No      Mobility  Bed Mobility Overal bed mobility: Independent             General bed mobility comments: no difficulty and reports no dizziness. Able to sit with upright  posture  Transfers Overall transfer level: Needs assistance Equipment used: None Transfers: Sit to/from Stand Sit to Stand: Min guard         General transfer comment: unsteady, however no overt LOB. Able to self correct. Would benefit from AD  Ambulation/Gait Ambulation/Gait assistance: Min assist Gait Distance (Feet): 40 Feet Assistive device: 1 person hand held assist Gait Pattern/deviations: Step-through pattern     General Gait Details: ambulated in room, however multiple LOB and weakness present in L side. L LE buckles slightly and pt reaches out for therapist hands. Assisted pt back to recliner. Further ambulation not performed at this time due to safety  Stairs            Wheelchair Mobility    Modified Rankin (Stroke Patients Only)       Balance Overall balance assessment: Needs assistance;History of Falls Sitting-balance support: No upper extremity supported;Feet supported Sitting balance-Leahy Scale: Normal     Standing balance support: No upper extremity supported Standing balance-Leahy Scale: Poor Standing balance comment: able to keep balance with eyes open, however quickly demonstrates post leaning with eyes closed needing external support for correction.  Single Leg Stance - Right Leg: 3 Single Leg Stance - Left Leg: 1                         Pertinent Vitals/Pain Pain Assessment: Faces Faces Pain Scale: Hurts even more Pain Location: headache Pain Descriptors / Indicators: Aching Pain Intervention(s): Limited activity within patient's tolerance;Repositioned    Home Living Family/patient expects to be discharged to:: Private  residence Living Arrangements:  (lives with girlfriend and shares custody of children) Available Help at Discharge: Family;Available 24 hours/day Type of Home: House Home Access: Stairs to enter Entrance Stairs-Rails: Can reach both;Left;Right Entrance Stairs-Number of Steps: 4 Home Layout: One level    Additional Comments: Pt has access to 2WW, W/C, SPC, and shower chair if needed (his father has equipment)    Prior Function Level of Independence: Independent         Comments: Pt indep in all aspects, 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, endorses 2-3 falls in past 12 mo (primarily at work and 1 due to dizziness)     Hand Dominance   Dominant Hand: Right    Extremity/Trunk Assessment   Upper Extremity Assessment Upper Extremity Assessment: Generalized weakness RUE Deficits / Details: strength 5/5 LUE Deficits / Details: decreased strength at 3+/5 LUE Sensation: decreased light touch LUE Coordination: decreased fine motor    Lower Extremity Assessment Lower Extremity Assessment: Generalized weakness RLE Deficits / Details: WFL 5/5, no sensation or coordination deficits LLE Deficits / Details: grossly 4+/5 proximal and 3+/5 in distal strength including DF. LLE Sensation: decreased light touch LLE Coordination: decreased fine motor       Communication   Communication: No difficulties  Cognition Arousal/Alertness: Awake/alert Behavior During Therapy: WFL for tasks assessed/performed Overall Cognitive Status: Within Functional Limits for tasks assessed                                 General Comments: alert and oriented, follows all commands      General Comments      Exercises Other Exercises Other Exercises: standing balance activities performed at bedside including maintaining balance with eyes open vs closed. Pt consistnetly loses balance in post direction with limited ability to self correct. Able to stand and march in standing with cga.   Assessment/Plan    PT Assessment Patient needs continued PT services  PT Problem List Decreased strength;Decreased activity tolerance;Decreased balance;Decreased mobility;Impaired sensation       PT Treatment Interventions Gait training;DME instruction;Stair  training;Therapeutic activities;Therapeutic exercise;Balance training    PT Goals (Current goals can be found in the Care Plan section)  Acute Rehab PT Goals Patient Stated Goal: get my sensation back and get better PT Goal Formulation: With patient Time For Goal Achievement: 08/19/19 Potential to Achieve Goals: Good    Frequency Min 2X/week   Barriers to discharge        Co-evaluation               AM-PAC PT "6 Clicks" Mobility  Outcome Measure Help needed turning from your back to your side while in a flat bed without using bedrails?: None Help needed moving from lying on your back to sitting on the side of a flat bed without using bedrails?: None Help needed moving to and from a bed to a chair (including a wheelchair)?: A Little Help needed standing up from a chair using your arms (e.g., wheelchair or bedside chair)?: A Little Help needed to walk in hospital room?: A Little Help needed climbing 3-5 steps with a railing? : A Lot 6 Click Score: 19    End of Session Equipment Utilized During Treatment: Gait belt Activity Tolerance: Patient tolerated treatment well Patient left: in chair;with chair alarm set Nurse Communication: Mobility status PT Visit Diagnosis: Muscle weakness (generalized) (M62.81);History of falling (Z91.81);Difficulty in walking, not  elsewhere classified (R26.2)    Time: 5189-8421 PT Time Calculation (min) (ACUTE ONLY): 21 min   Charges:   PT Evaluation $PT Eval Low Complexity: 1 Low PT Treatments $Therapeutic Activity: 8-22 mins        Elizabeth Palau, PT, DPT [phone]   Evaristo Tsuda 08/05/2019, 2:24 PM

## 2019-08-05 NOTE — Progress Notes (Signed)
Ch visited with Pt in response to OR for an AD. Pt said that he had seizures, and it looked like a stroke. Pt said that he has three children that he wants to get out and take care of them; wife still pregnant with the third child. Pt asked for prayer. RN walked in and did some check in between. Ch prayed with Pt. Pt was grateful for prayer and visit.

## 2019-08-06 ENCOUNTER — Telehealth: Payer: Self-pay | Admitting: General Practice

## 2019-08-06 NOTE — Telephone Encounter (Signed)
Attempted to contact pt. Phone number invalid.

## 2019-08-07 NOTE — Telephone Encounter (Signed)
Phone number disconnected- last contact attempt

## 2020-02-16 ENCOUNTER — Emergency Department: Payer: Self-pay

## 2020-02-16 ENCOUNTER — Encounter: Payer: Self-pay | Admitting: Emergency Medicine

## 2020-02-16 ENCOUNTER — Emergency Department
Admission: EM | Admit: 2020-02-16 | Discharge: 2020-02-17 | Disposition: A | Discharge status: Home / Self Care | Payer: Self-pay | Attending: Emergency Medicine | Admitting: Emergency Medicine

## 2020-02-16 DIAGNOSIS — Z20822 Contact with and (suspected) exposure to covid-19: Secondary | ICD-10-CM | POA: Insufficient documentation

## 2020-02-16 DIAGNOSIS — Z79899 Other long term (current) drug therapy: Secondary | ICD-10-CM | POA: Insufficient documentation

## 2020-02-16 DIAGNOSIS — G40909 Epilepsy, unspecified, not intractable, without status epilepticus: Secondary | ICD-10-CM | POA: Insufficient documentation

## 2020-02-16 DIAGNOSIS — Z87891 Personal history of nicotine dependence: Secondary | ICD-10-CM | POA: Insufficient documentation

## 2020-02-16 LAB — CBC
HCT: 44.4 % (ref 39.0–52.0)
Hemoglobin: 14.6 g/dL (ref 13.0–17.0)
MCH: 34.1 pg — ABNORMAL HIGH (ref 26.0–34.0)
MCHC: 32.9 g/dL (ref 30.0–36.0)
MCV: 103.7 fL — ABNORMAL HIGH (ref 80.0–100.0)
Platelets: 271 10*3/uL (ref 150–400)
RBC: 4.28 MIL/uL (ref 4.22–5.81)
RDW: 12.5 % (ref 11.5–15.5)
WBC: 8.2 10*3/uL (ref 4.0–10.5)
nRBC: 0 % (ref 0.0–0.2)

## 2020-02-16 MED ORDER — LEVETIRACETAM IN NACL 1500 MG/100ML IV SOLN
1500.0000 mg | Freq: Once | INTRAVENOUS | Status: AC
  Administered 2020-02-16: 1500 mg via INTRAVENOUS
  Filled 2020-02-16: qty 100

## 2020-02-16 NOTE — ED Triage Notes (Signed)
Pt reports he has been off of his Keppra x2 weeks and today has woke up with memory loss in chair and on floor. Pt unknown if he hit his head. Pt lives alone and no witness of events. No evidence of injury to head, face, mouth, tongue. Pt also c/o left sided chest pain and all over muscle pain. Pt A&O x4.

## 2020-02-17 ENCOUNTER — Emergency Department: Payer: Self-pay

## 2020-02-17 LAB — COMPREHENSIVE METABOLIC PANEL
ALT: 11 U/L (ref 0–44)
AST: 14 U/L — ABNORMAL LOW (ref 15–41)
Albumin: 3.6 g/dL (ref 3.5–5.0)
Alkaline Phosphatase: 60 U/L (ref 38–126)
Anion gap: 5 (ref 5–15)
BUN: 10 mg/dL (ref 6–20)
CO2: 21 mmol/L — ABNORMAL LOW (ref 22–32)
Calcium: 8 mg/dL — ABNORMAL LOW (ref 8.9–10.3)
Chloride: 111 mmol/L (ref 98–111)
Creatinine, Ser: 1.08 mg/dL (ref 0.61–1.24)
GFR, Estimated: >60 mL/min (ref >60)
Glucose, Bld: 100 mg/dL — ABNORMAL HIGH (ref 70–99)
Potassium: 3.7 mmol/L (ref 3.5–5.1)
Sodium: 137 mmol/L (ref 135–145)
Total Bilirubin: 0.4 mg/dL (ref 0.3–1.2)
Total Protein: 6 g/dL — ABNORMAL LOW (ref 6.5–8.1)

## 2020-02-17 LAB — TROPONIN I (HIGH SENSITIVITY)
Troponin I (High Sensitivity): 3 ng/L (ref <18)
Troponin I (High Sensitivity): 3 ng/L (ref <18)

## 2020-02-17 LAB — CK: Total CK: 48 U/L — ABNORMAL LOW (ref 49–397)

## 2020-02-17 LAB — RESP PANEL BY RT-PCR (FLU A&B, COVID) ARPGX2
Influenza A by PCR: NEGATIVE
Influenza B by PCR: NEGATIVE
SARS Coronavirus 2 by RT PCR: NEGATIVE

## 2020-02-17 LAB — ETHANOL: Alcohol, Ethyl (B): <10 mg/dL (ref <10)

## 2020-02-17 LAB — LACTIC ACID, PLASMA: Lactic Acid, Venous: 1.3 mmol/L (ref 0.5–1.9)

## 2020-02-17 IMAGING — CT CT HEAD W/O CM
4 series · 16 of 47 positions shown, 18 images · non-contrast
Comparison: [DATE]

CLINICAL DATA: Seizure, altered mental status.

EXAM:
CT HEAD WITHOUT CONTRAST
TECHNIQUE: Contiguous axial images were obtained from the base of the skull
through the vertex without intravenous contrast.

[Series 2: head bone · axial · 0.40mm/px · z∈[-117,-85]mm · 3 of 80 slices shown]
[im 8/80  bone]
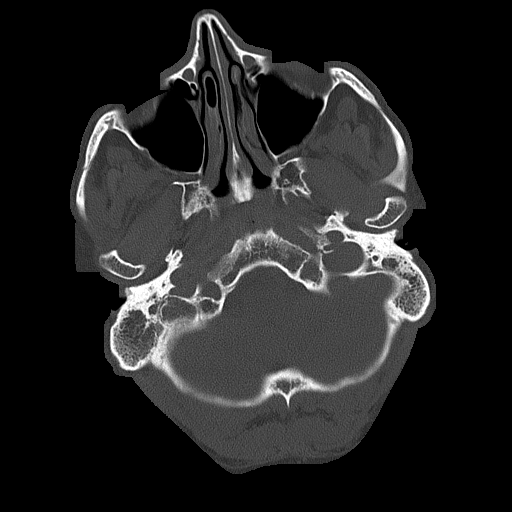
[im 16/80  bone]
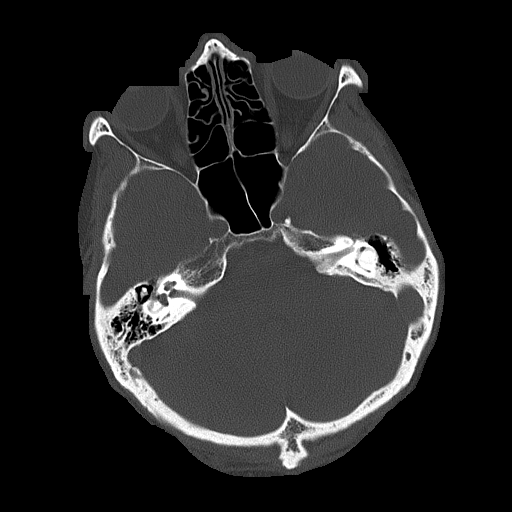
[im 24/80  bone]
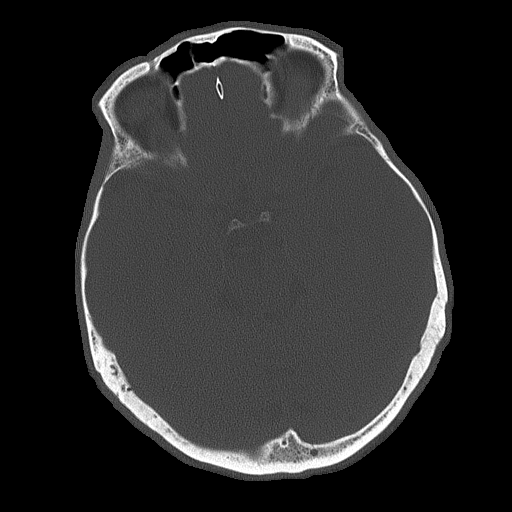

[Series 3: head wo · axial · 0.40mm/px · z∈[-116,+4]mm · 7 of 32 slices shown, 9 images]
[im 4/32  brain]
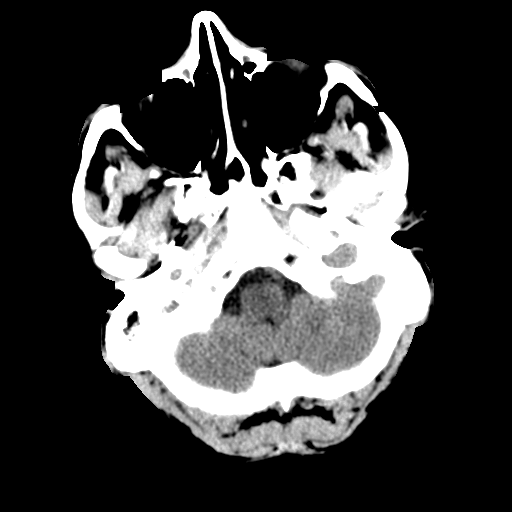
[im 4/32  bone]
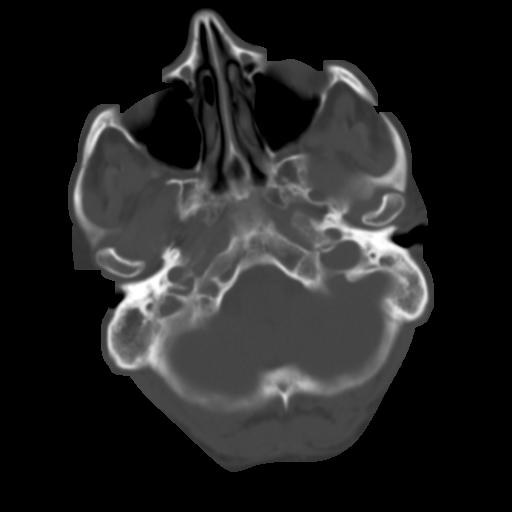
[im 8/32  brain]
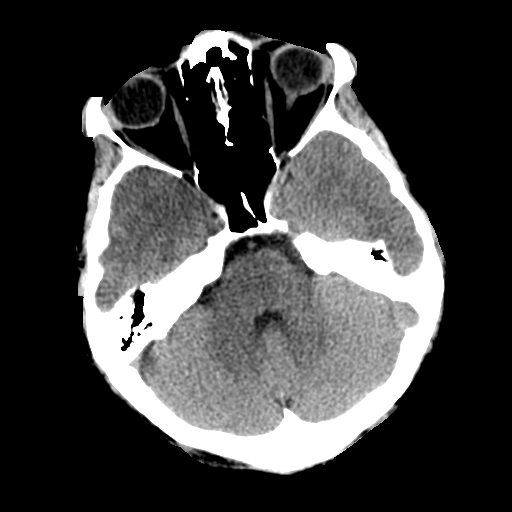
[im 12/32  brain]
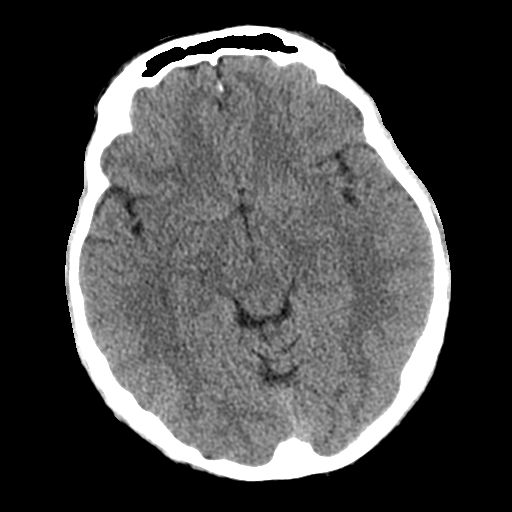
[im 16/32  brain]
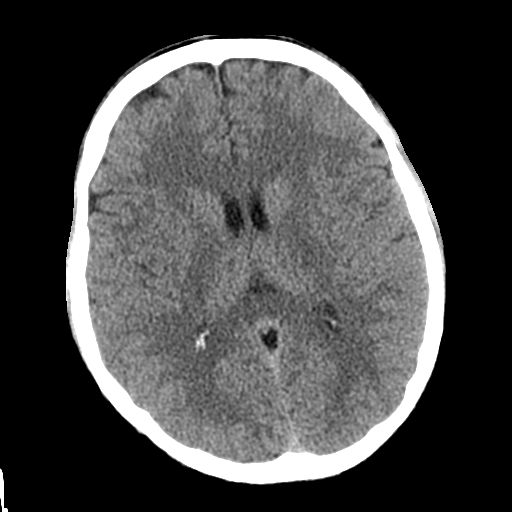
[im 20/32  brain]
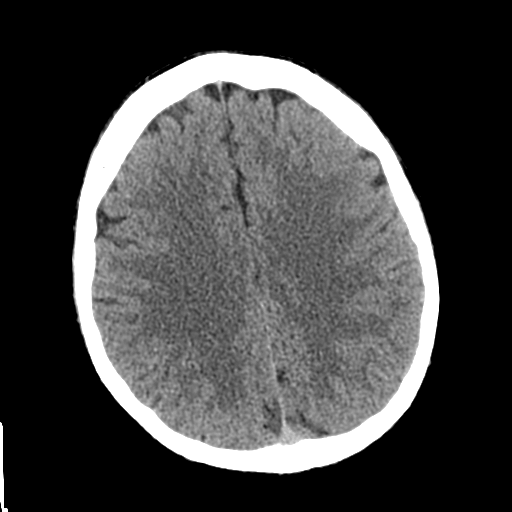
[im 20/32  bone]
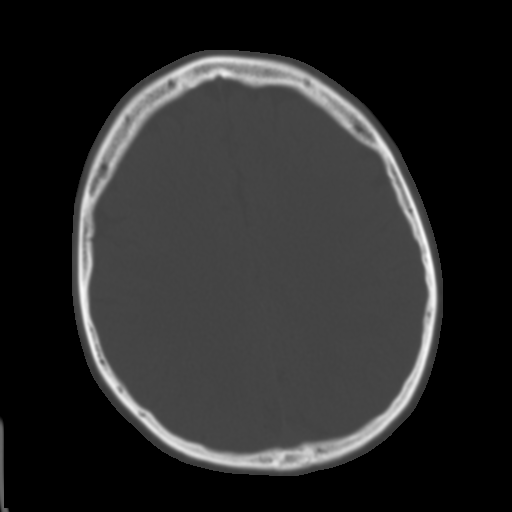
[im 24/32  brain]
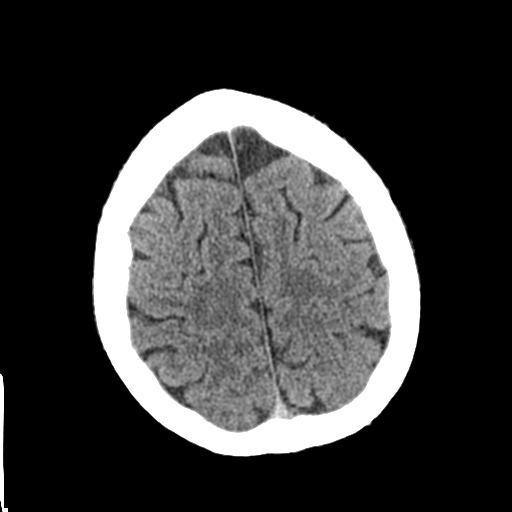
[im 28/32  brain]
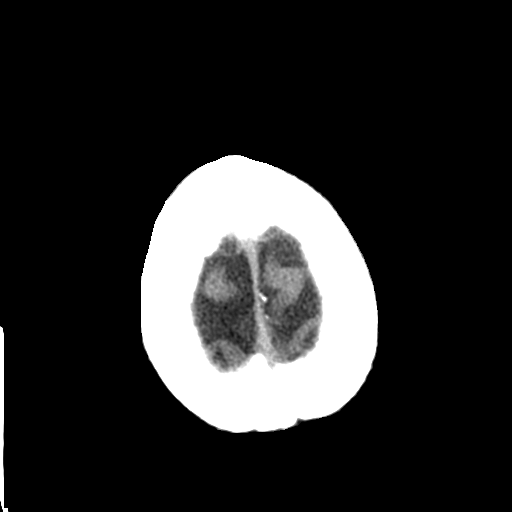

[Series 4: coronal soft tissue · coronal · 0.30mm/px · 3 of 67 slices shown]
[im 25/67  brain]
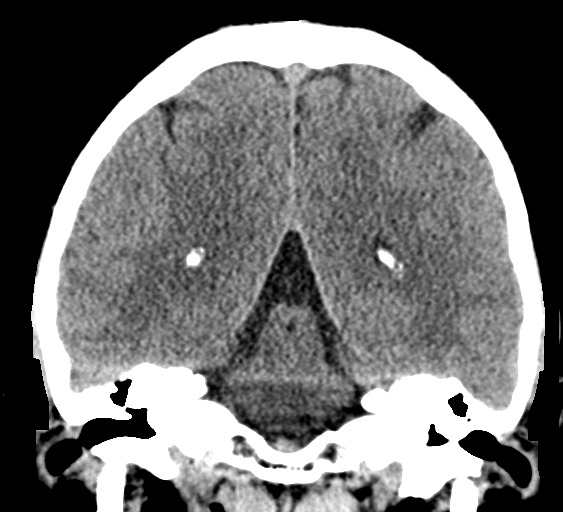
[im 31/67  brain]
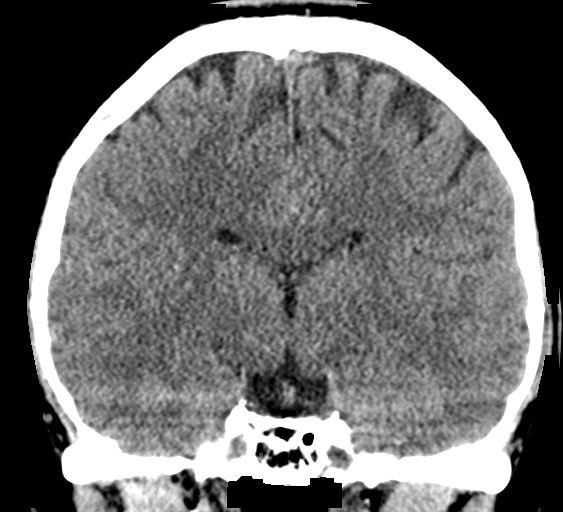
[im 36/67  brain]
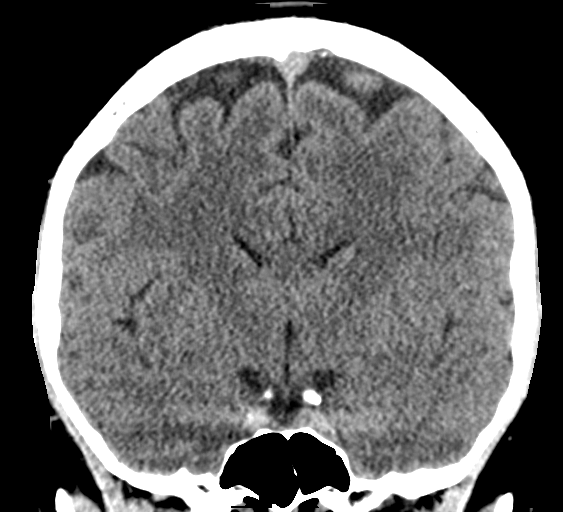

[Series 5: sagittal soft tissue · sagittal · 0.30mm/px · 3 of 56 slices shown]
[im 19/56  brain]
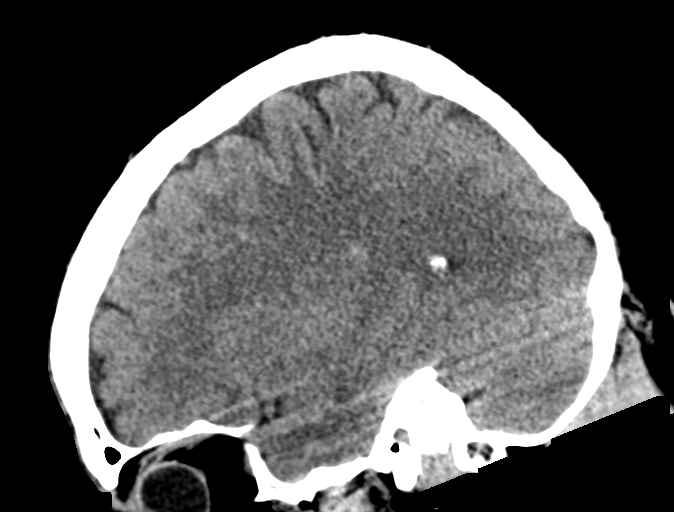
[im 28/56  brain]
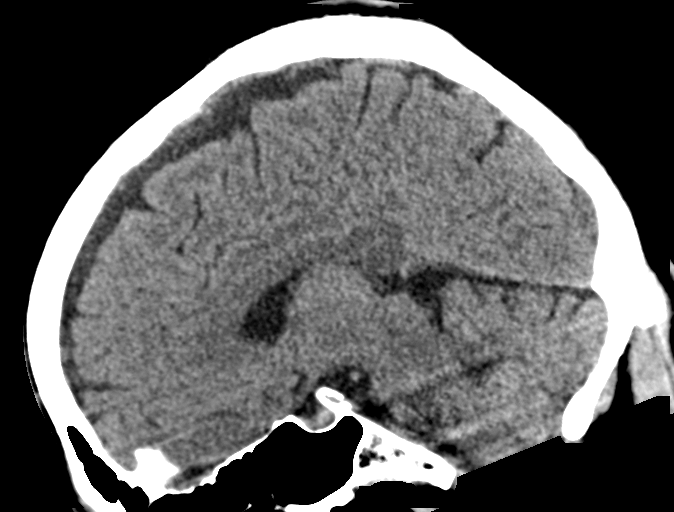
[im 37/56  brain]
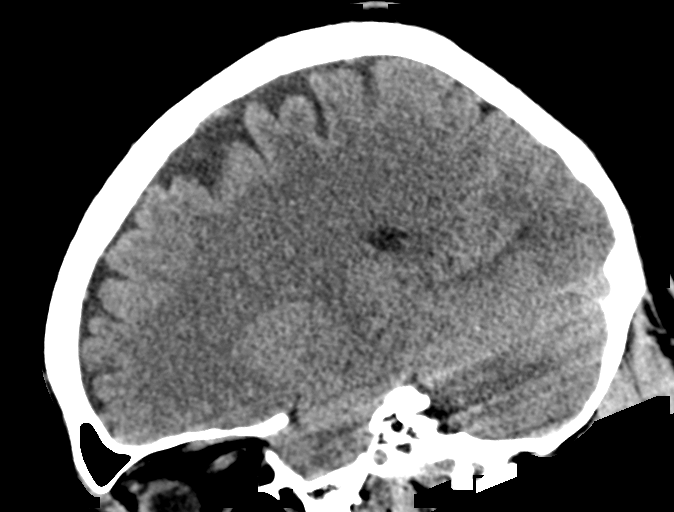

[16 of 47 positions shown; findings below may reference images not displayed]

FINDINGS: Brain: No evidence of acute infarction, hemorrhage, hydrocephalus,
extra-axial collection or mass lesion/mass effect.

Vascular: No hyperdense vessel or unexpected calcification.

Skull: Normal. Negative for fracture or focal lesion.

Sinuses/Orbits: No acute finding.

Other: None.
IMPRESSION: No acute intracranial pathology.

## 2020-02-17 MED ORDER — ACETAMINOPHEN 500 MG PO TABS
1000.0000 mg | ORAL_TABLET | Freq: Once | ORAL | Status: AC
  Administered 2020-02-17: 05:00:00 1000 mg via ORAL
  Filled 2020-02-17: qty 2

## 2020-02-17 MED ORDER — LEVETIRACETAM IN NACL 1500 MG/100ML IV SOLN
1500.0000 mg | INTRAVENOUS | Status: AC
  Administered 2020-02-17: 1500 mg via INTRAVENOUS
  Filled 2020-02-17: qty 100

## 2020-02-17 MED ORDER — LEVETIRACETAM 1000 MG PO TABS: 1000.0000 mg | ORAL_TABLET | Freq: Two times a day (BID) | ORAL | 2 refills | Status: DC

## 2020-02-17 MED ORDER — KETOROLAC TROMETHAMINE 30 MG/ML IJ SOLN
15.0000 mg | Freq: Once | INTRAMUSCULAR | Status: AC
  Administered 2020-02-17: 05:00:00 15 mg via INTRAVENOUS
  Filled 2020-02-17: qty 1

## 2020-02-17 MED ORDER — SODIUM CHLORIDE 0.9 % IV BOLUS
1000.0000 mL | Freq: Once | INTRAVENOUS | Status: AC
  Administered 2020-02-17: 1000 mL via INTRAVENOUS

## 2020-02-17 NOTE — ED Notes (Signed)
Upon entering the room to dc fluids, pt was alert and oriented and suddenly patient turned onto Right side and tucked all 4 ext in. Pt closed eyes and grunted a few times. Pt then opened eyes and was alert.  Pt resting in bed at this time

## 2020-02-17 NOTE — ED Notes (Signed)
Pt resting in bed. NO needs at this time

## 2020-02-17 NOTE — ED Notes (Signed)
Repeat labs sent.

## 2020-02-17 NOTE — ED Notes (Signed)
Pt assisted to BR.

## 2020-02-17 NOTE — ED Notes (Signed)
CT tech endorsing "pt appeared to have a seizure while on the ct table. Pt turned to side and appeared to shake." Md aware
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
## 2020-02-17 NOTE — ED Notes (Signed)
RN wtinessed seizure in progress. Pt hands shaking and tense. Pt awoke with verbal and touch stimulation. Pt awake at this time. MD at bedside.

## 2020-02-17 NOTE — Discharge Instructions (Signed)

## 2020-10-12 ENCOUNTER — Emergency Department: Payer: Self-pay

## 2020-10-12 ENCOUNTER — Inpatient Hospital Stay: Payer: Self-pay

## 2020-10-12 ENCOUNTER — Other Ambulatory Visit: Payer: Self-pay

## 2020-10-12 ENCOUNTER — Inpatient Hospital Stay
Admission: EM | Admit: 2020-10-12 | Discharge: 2020-10-12 | DRG: 101 | Discharge status: Against Medical Advice | Payer: Self-pay | Attending: Family Medicine | Admitting: Family Medicine

## 2020-10-12 DIAGNOSIS — W25XXXA Contact with sharp glass, initial encounter: Secondary | ICD-10-CM | POA: Diagnosis present

## 2020-10-12 DIAGNOSIS — R569 Unspecified convulsions: Secondary | ICD-10-CM

## 2020-10-12 DIAGNOSIS — Z79899 Other long term (current) drug therapy: Secondary | ICD-10-CM

## 2020-10-12 DIAGNOSIS — F411 Generalized anxiety disorder: Secondary | ICD-10-CM | POA: Diagnosis present

## 2020-10-12 DIAGNOSIS — R531 Weakness: Secondary | ICD-10-CM | POA: Diagnosis present

## 2020-10-12 DIAGNOSIS — Z5329 Procedure and treatment not carried out because of patient's decision for other reasons: Secondary | ICD-10-CM | POA: Diagnosis present

## 2020-10-12 DIAGNOSIS — Y990 Civilian activity done for income or pay: Secondary | ICD-10-CM

## 2020-10-12 DIAGNOSIS — G40919 Epilepsy, unspecified, intractable, without status epilepticus: Secondary | ICD-10-CM

## 2020-10-12 DIAGNOSIS — R202 Paresthesia of skin: Secondary | ICD-10-CM | POA: Diagnosis present

## 2020-10-12 DIAGNOSIS — Z9119 Patient's noncompliance with other medical treatment and regimen: Secondary | ICD-10-CM

## 2020-10-12 DIAGNOSIS — Z885 Allergy status to narcotic agent status: Secondary | ICD-10-CM

## 2020-10-12 DIAGNOSIS — Y92009 Unspecified place in unspecified non-institutional (private) residence as the place of occurrence of the external cause: Secondary | ICD-10-CM

## 2020-10-12 DIAGNOSIS — Z20822 Contact with and (suspected) exposure to covid-19: Secondary | ICD-10-CM | POA: Diagnosis present

## 2020-10-12 DIAGNOSIS — Z825 Family history of asthma and other chronic lower respiratory diseases: Secondary | ICD-10-CM

## 2020-10-12 DIAGNOSIS — R200 Anesthesia of skin: Secondary | ICD-10-CM | POA: Diagnosis present

## 2020-10-12 DIAGNOSIS — Z87891 Personal history of nicotine dependence: Secondary | ICD-10-CM

## 2020-10-12 DIAGNOSIS — S61511A Laceration without foreign body of right wrist, initial encounter: Secondary | ICD-10-CM | POA: Diagnosis present

## 2020-10-12 DIAGNOSIS — Z8249 Family history of ischemic heart disease and other diseases of the circulatory system: Secondary | ICD-10-CM

## 2020-10-12 DIAGNOSIS — G40909 Epilepsy, unspecified, not intractable, without status epilepticus: Principal | ICD-10-CM | POA: Diagnosis present

## 2020-10-12 DIAGNOSIS — T426X6A Underdosing of other antiepileptic and sedative-hypnotic drugs, initial encounter: Secondary | ICD-10-CM | POA: Diagnosis present

## 2020-10-12 DIAGNOSIS — F32A Depression, unspecified: Secondary | ICD-10-CM | POA: Diagnosis present

## 2020-10-12 LAB — CBC WITH DIFFERENTIAL/PLATELET
Abs Immature Granulocytes: 0.02 10*3/uL (ref 0.00–0.07)
Basophils Absolute: 0 10*3/uL (ref 0.0–0.1)
Basophils Relative: 0 %
Eosinophils Absolute: 0 10*3/uL (ref 0.0–0.5)
Eosinophils Relative: 0 %
HCT: 38.4 % — ABNORMAL LOW (ref 39.0–52.0)
Hemoglobin: 13.6 g/dL (ref 13.0–17.0)
Immature Granulocytes: 0 %
Lymphocytes Relative: 15 %
Lymphs Abs: 1.1 10*3/uL (ref 0.7–4.0)
MCH: 35.2 pg — ABNORMAL HIGH (ref 26.0–34.0)
MCHC: 35.4 g/dL (ref 30.0–36.0)
MCV: 99.5 fL (ref 80.0–100.0)
Monocytes Absolute: 0.2 10*3/uL (ref 0.1–1.0)
Monocytes Relative: 3 %
Neutro Abs: 5.9 10*3/uL (ref 1.7–7.7)
Neutrophils Relative %: 82 %
Platelets: 214 10*3/uL (ref 150–400)
RBC: 3.86 MIL/uL — ABNORMAL LOW (ref 4.22–5.81)
RDW: 12.2 % (ref 11.5–15.5)
WBC: 7.3 10*3/uL (ref 4.0–10.5)
nRBC: 0 % (ref 0.0–0.2)

## 2020-10-12 LAB — COMPREHENSIVE METABOLIC PANEL
ALT: 14 U/L (ref 0–44)
AST: 22 U/L (ref 15–41)
Albumin: 4.5 g/dL (ref 3.5–5.0)
Alkaline Phosphatase: 53 U/L (ref 38–126)
Anion gap: 6 (ref 5–15)
BUN: 13 mg/dL (ref 6–20)
CO2: 24 mmol/L (ref 22–32)
Calcium: 8.9 mg/dL (ref 8.9–10.3)
Chloride: 107 mmol/L (ref 98–111)
Creatinine, Ser: 1.09 mg/dL (ref 0.61–1.24)
GFR, Estimated: >60 mL/min (ref >60)
Glucose, Bld: 115 mg/dL — ABNORMAL HIGH (ref 70–99)
Potassium: 3.8 mmol/L (ref 3.5–5.1)
Sodium: 137 mmol/L (ref 135–145)
Total Bilirubin: 0.8 mg/dL (ref 0.3–1.2)
Total Protein: 7.1 g/dL (ref 6.5–8.1)

## 2020-10-12 LAB — URINE DRUG SCREEN, QUALITATIVE (ARMC ONLY)
Amphetamines, Ur Screen: NOT DETECTED
Barbiturates, Ur Screen: NOT DETECTED
Benzodiazepine, Ur Scrn: POSITIVE — AB
Cannabinoid 50 Ng, Ur ~~LOC~~: NOT DETECTED
Cocaine Metabolite,Ur ~~LOC~~: NOT DETECTED
MDMA (Ecstasy)Ur Screen: NOT DETECTED
Methadone Scn, Ur: NOT DETECTED
Opiate, Ur Screen: NOT DETECTED
Phencyclidine (PCP) Ur S: NOT DETECTED
Tricyclic, Ur Screen: POSITIVE — AB

## 2020-10-12 LAB — RESP PANEL BY RT-PCR (FLU A&B, COVID) ARPGX2
Influenza A by PCR: NEGATIVE
Influenza B by PCR: NEGATIVE
SARS Coronavirus 2 by RT PCR: NEGATIVE

## 2020-10-12 LAB — MAGNESIUM: Magnesium: 2 mg/dL (ref 1.7–2.4)

## 2020-10-12 IMAGING — DX DG WRIST COMPLETE 3+V*R*
4 series · 4 of 4 positions shown · non-contrast
Comparison: None.

CLINICAL DATA: Right wrist laceration.  Concern for retained glass

EXAM:
RIGHT WRIST - COMPLETE 3+ VIEW

[wrist ap (1 of 2)]
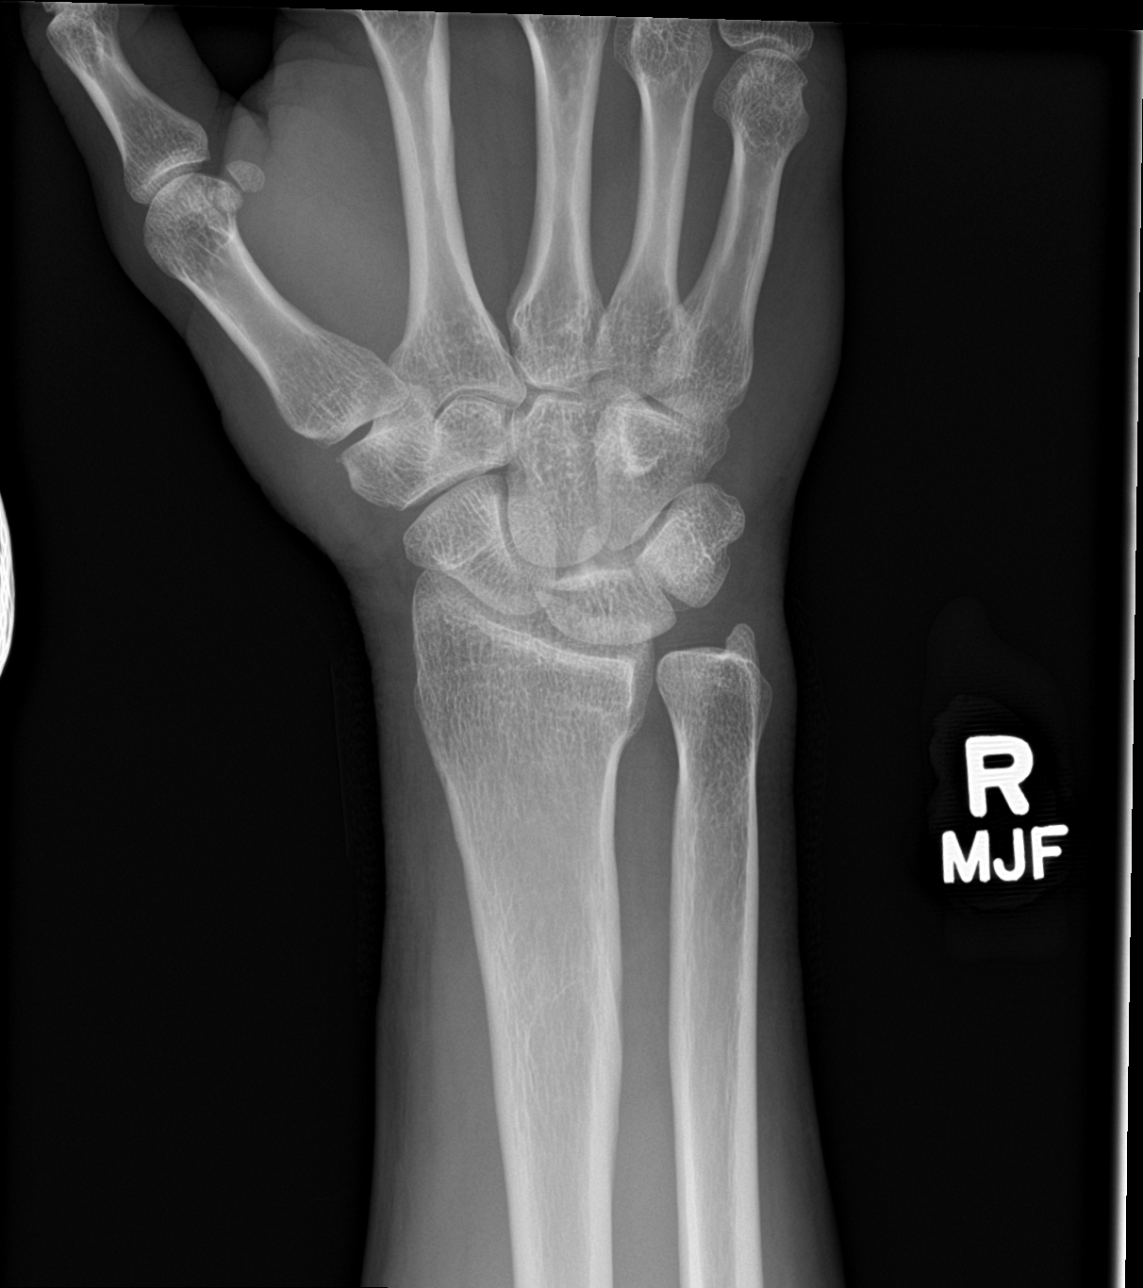

[wrist obl]
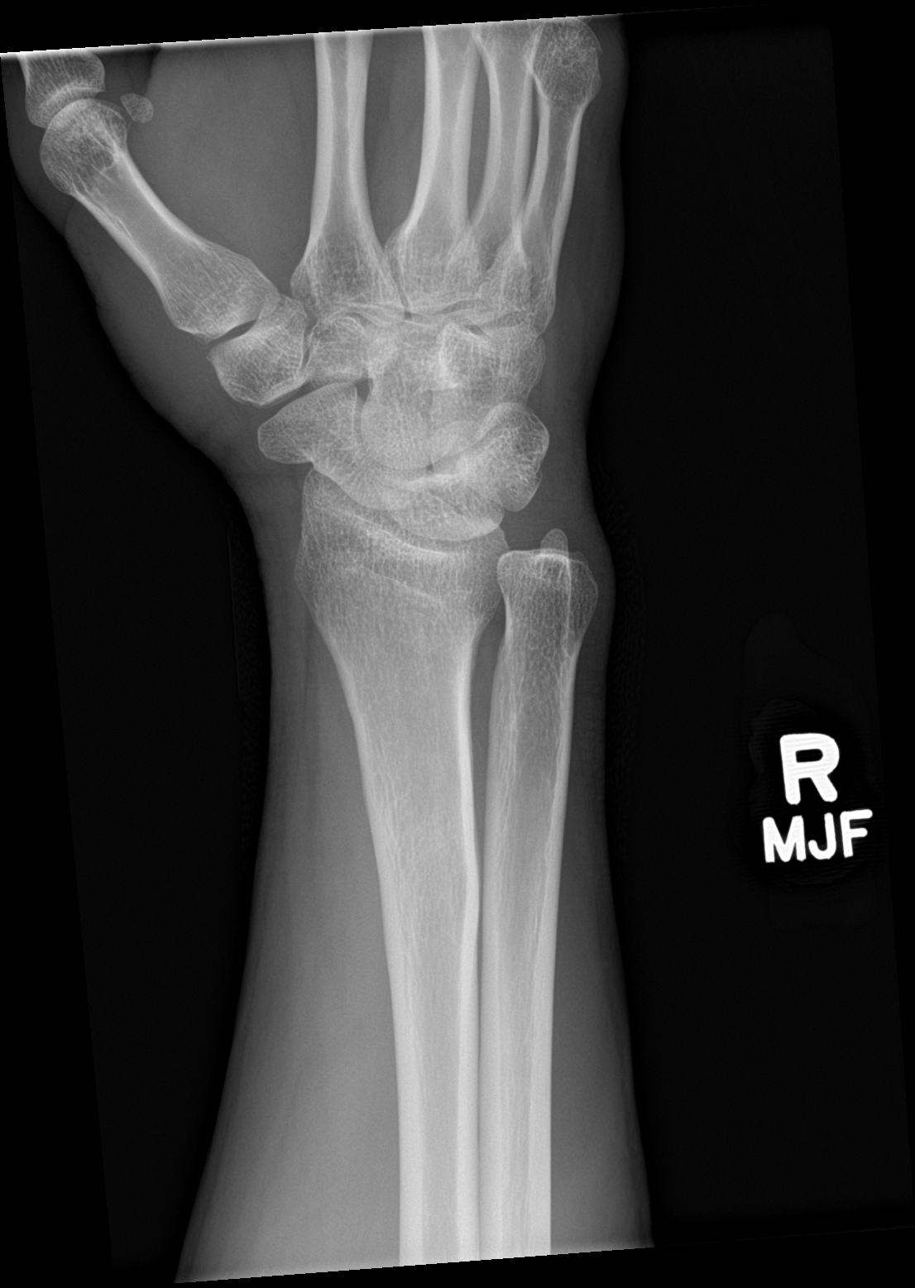

[wrist lat]
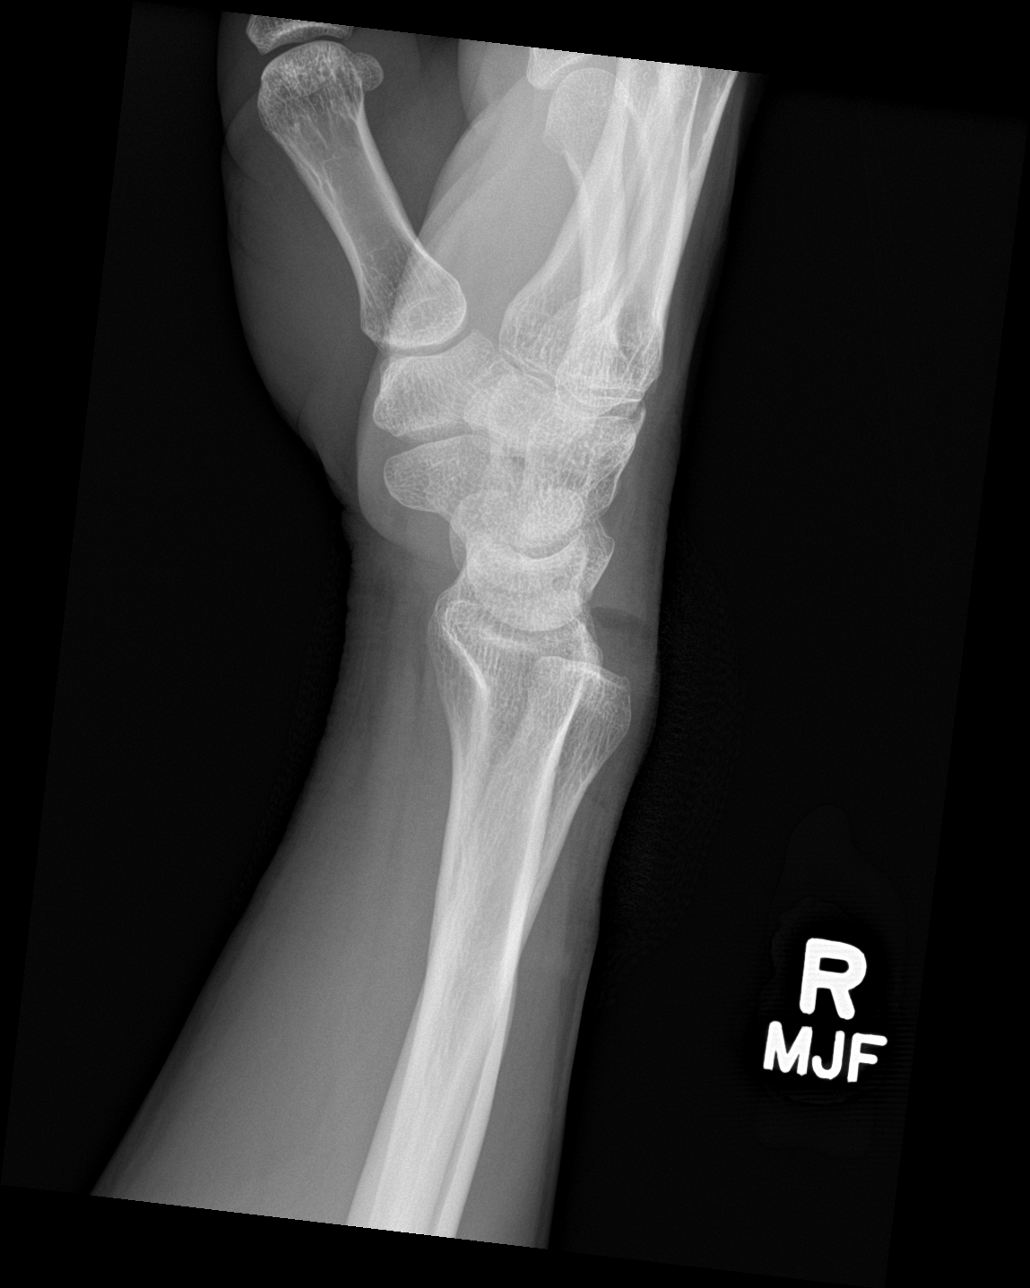

[wrist ap (2 of 2)]
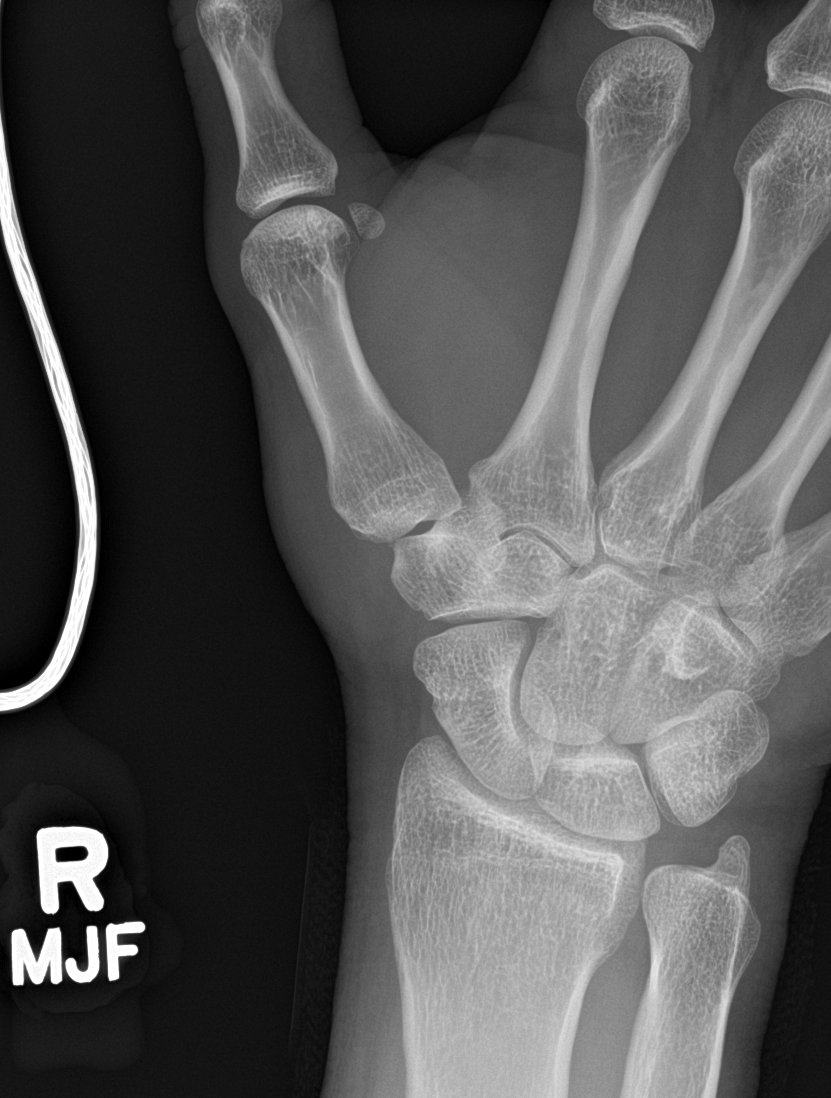

[4 of 4 positions shown; findings below may reference images not displayed]

FINDINGS: There is no evidence of fracture or dislocation. There is no
evidence of arthropathy or other focal bone abnormality. Air seen
within the soft tissues of the dorsum of the wrist and distal
forearm likely reflecting laceration. No radiopaque foreign bodies
are evident within the soft tissues.
IMPRESSION: 1. No acute osseous abnormality of the right wrist. No radiopaque
foreign body identified.
2. Air seen within the soft tissues of the dorsum of the wrist and
distal forearm likely reflecting laceration.

## 2020-10-12 IMAGING — MR MR HEAD W/O CM
14 series · 42 of 48 positions shown · non-contrast
Comparison: None.

CLINICAL DATA: Neuro deficit, acute, stroke suspected

EXAM:
MRI HEAD WITHOUT CONTRAST
TECHNIQUE: Multiplanar, multiecho pulse sequences of the brain and surrounding
structures were obtained without intravenous contrast.

[Series 5: ax dwi_tracew · axial · 3.0mm · 0.65mm/px · z∈[-88,+66]mm · 4 of 48 slices shown]
[im 1/48]
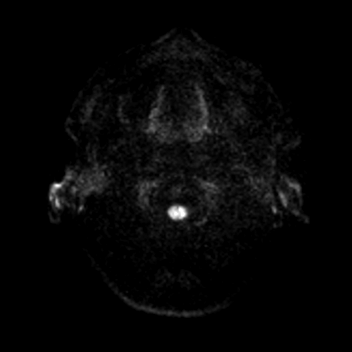
[im 16/48]
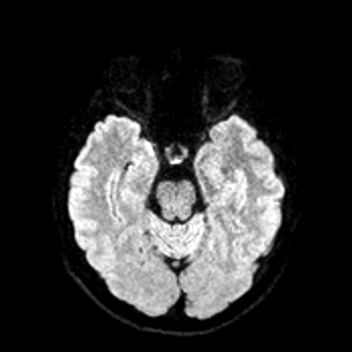
[im 32/48]
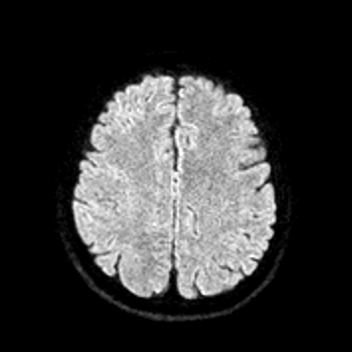
[im 48/48]
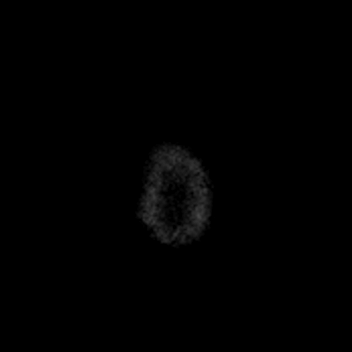

[Series 6: ax dwi_adc · axial · 3.0mm · 0.65mm/px · z∈[-88,+66]mm · 3 of 48 slices shown]
[im 1/48]
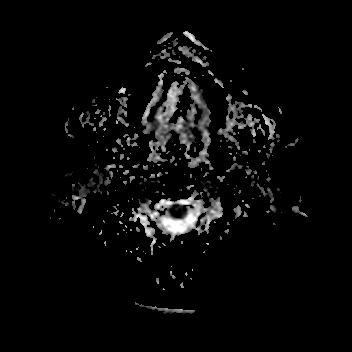
[im 24/48]
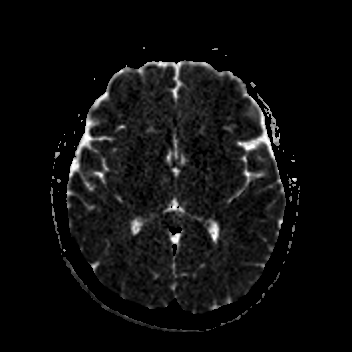
[im 48/48]
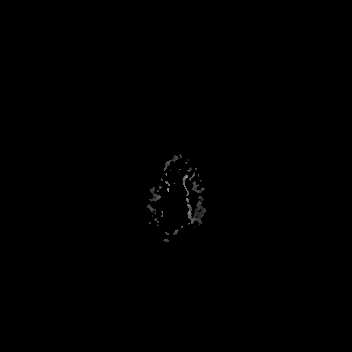

[Series 7: cor dwi_tracew · coronal · 5.0mm · 0.65mm/px · 2 of 38 slices shown]
[im 1/38]
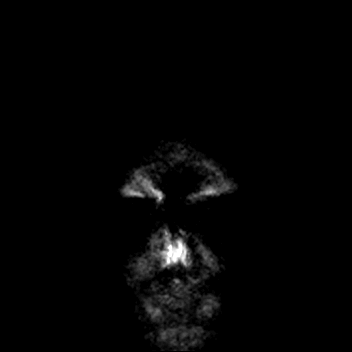
[im 38/38]
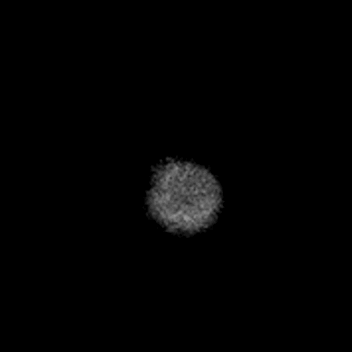

[Series 8: cor dwi_adc · coronal · 5.0mm · 0.65mm/px · 2 of 38 slices shown]
[im 1/38]
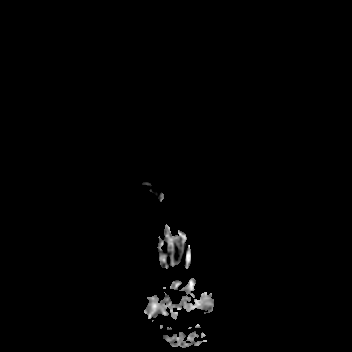
[im 38/38]
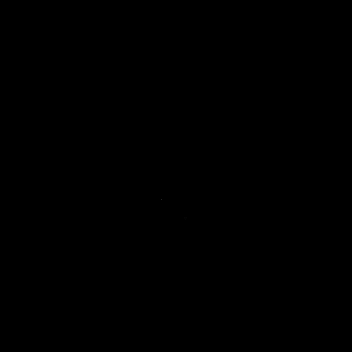

[Series 9: T1 · sagittal · 5.0mm · 0.62mm/px · 1 of 23 slices shown (1 of 2)]
[im 1/23]
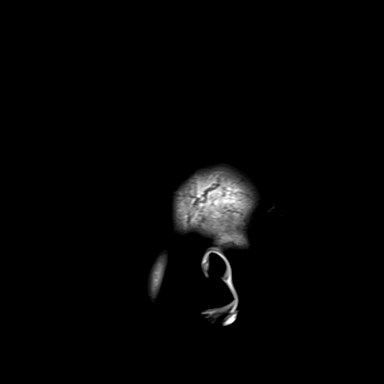

[Series 10: T2 · axial · 5.0mm · 0.53mm/px · z∈[-92,+63]mm · 2 of 27 slices shown (1 of 2)]
[im 1/27]
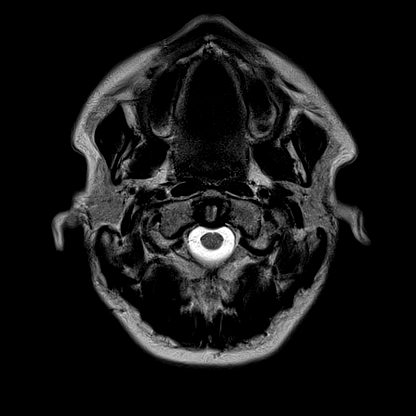
[im 27/27]
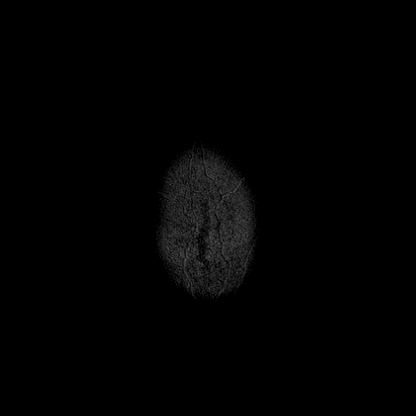

[Series 11: mag_images · axial · 3.0mm · 0.90mm/px · z∈[-103,+73]mm · 4 of 60 slices shown]
[im 1/60]
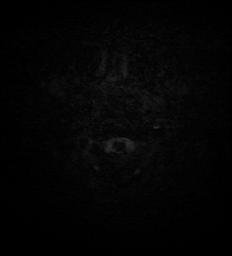
[im 20/60]
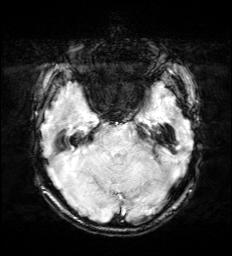
[im 40/60]
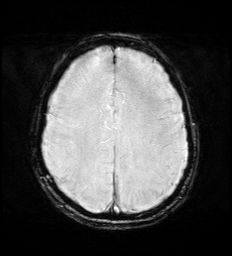
[im 60/60]
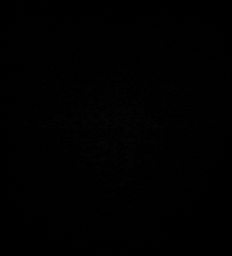

[Series 12: pha_images · axial · 3.0mm · 0.90mm/px · z∈[-100,+61]mm · 3 of 55 slices shown]
[im 1/55]
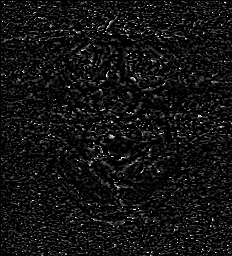
[im 28/55]
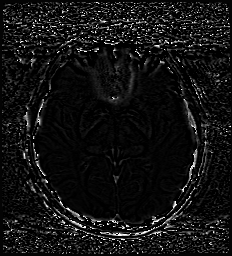
[im 55/55]
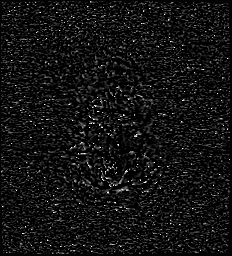

[Series 13: swi_images · axial · 3.0mm · 0.90mm/px · z∈[-103,+73]mm · 4 of 60 slices shown]
[im 1/60]
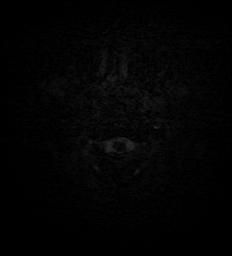
[im 20/60]
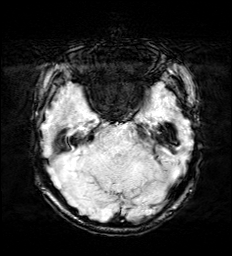
[im 40/60]
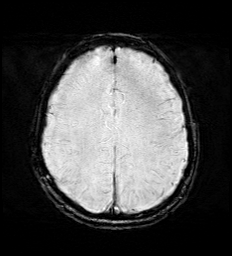
[im 60/60]
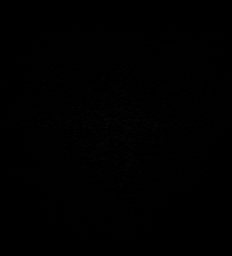

[Series 15: FLAIR · axial · 3.0mm · 0.53mm/px · z∈[-95,+66]mm · 3 of 55 slices shown (1 of 2)]
[im 1/55]
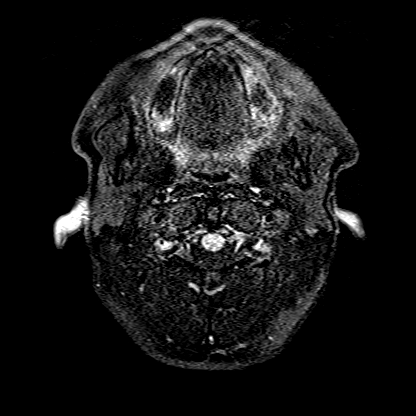
[im 28/55]
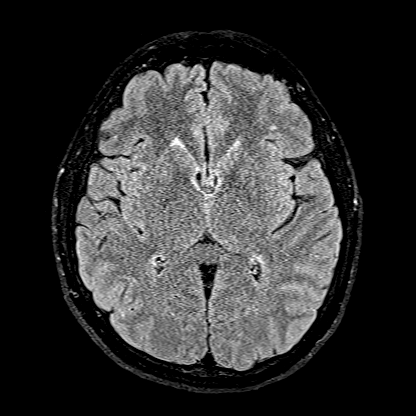
[im 55/55]
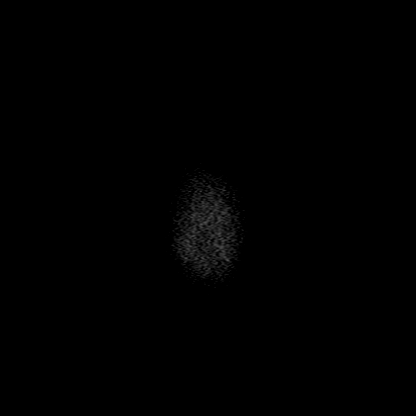

[Series 16: T1 · axial · 1.0mm · 0.98mm/px · z∈[-101,+73]mm · 8 of 176 slices shown (2 of 2)]
[im 1/176]
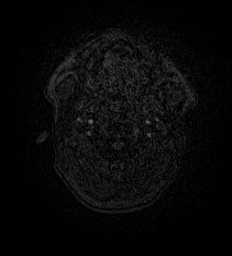
[im 20/176]
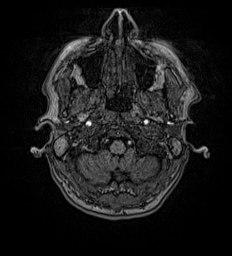
[im 59/176]
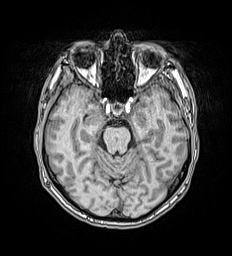
[im 78/176]
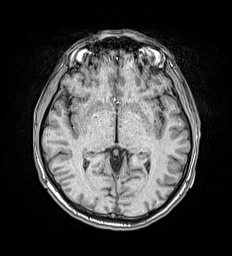
[im 98/176]
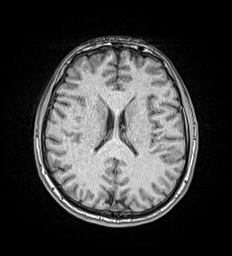
[im 117/176]
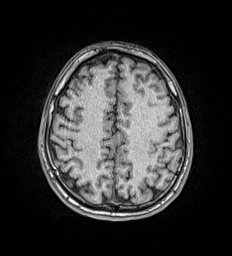
[im 156/176]
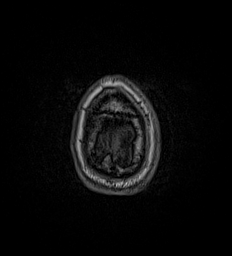
[im 176/176]
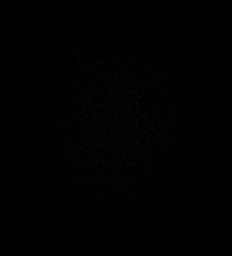

[Series 17: T2 · coronal · 3.0mm · 0.47mm/px · 2 of 35 slices shown (2 of 2)]
[im 1/35]
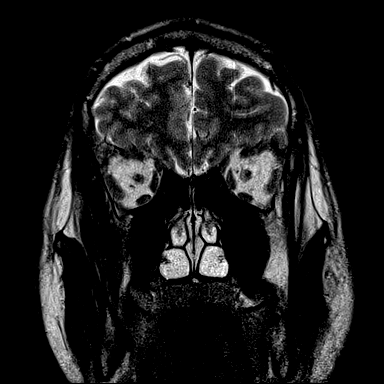
[im 35/35]
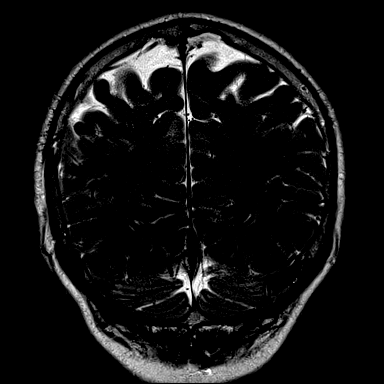

[Series 18: FLAIR · coronal · 3.0mm · 0.47mm/px · 2 of 35 slices shown (2 of 2)]
[im 1/35]
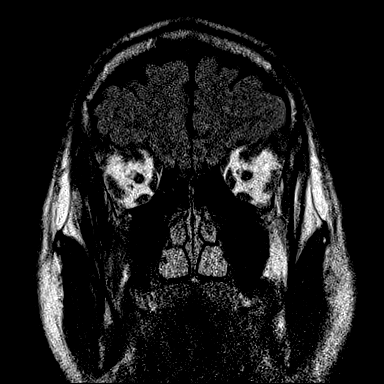
[im 35/35]
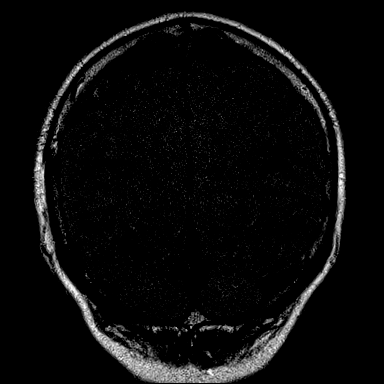

[Series 1021: cor thins rl · coronal · 3.0mm · 0.49mm/px · 2 of 107 slices shown]
[im 1/107]
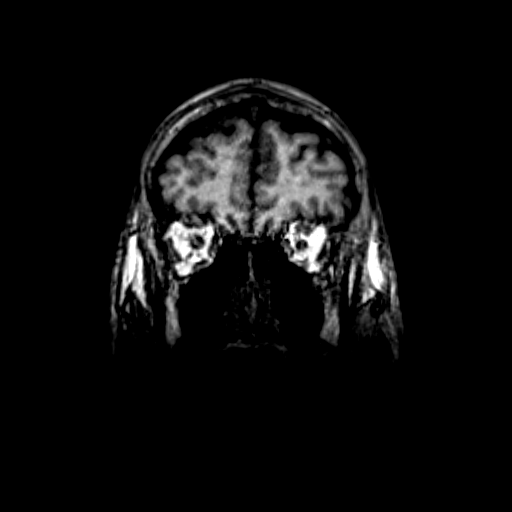
[im 22/107]
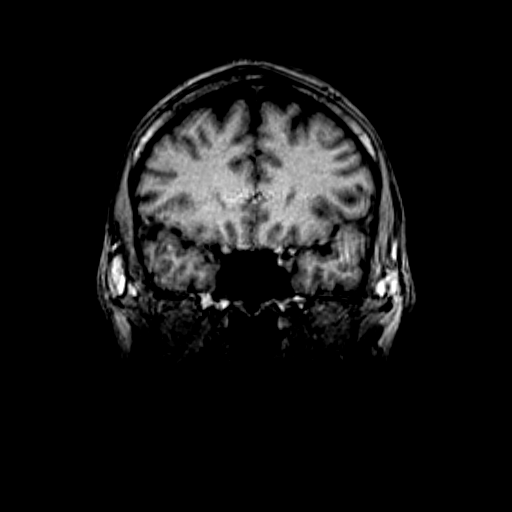

[42 of 48 positions shown; findings below may reference images not displayed]

FINDINGS: Brain: There is no acute infarction or intracranial hemorrhage.
There is no intracranial mass, mass effect, or edema. There is no
hydrocephalus or extra-axial fluid collection. Ventricles and sulci
are normal in size and configuration. A few scattered small foci of
T2 hyperintensity in the supratentorial white matter likely reflect
nonspecific gliosis/demyelination of doubtful clinical significance.

Vascular: Major vessel flow voids at the skull base are preserved.

Skull and upper cervical spine: Normal marrow signal is preserved.

Sinuses/Orbits: Minor mucosal thickening.  Orbits are unremarkable.

Other: Sella is unremarkable. Mild patchy left mastoid fluid
opacification.
IMPRESSION: No evidence of recent infarction, hemorrhage, or mass.

## 2020-10-12 IMAGING — CT CT HEAD W/O CM
3 of 5 series · 16 of 47 positions shown, 19 images · non-contrast
Comparison: [DATE]

CLINICAL DATA: Seizure

EXAM:
CT HEAD WITHOUT CONTRAST
TECHNIQUE: Contiguous axial images were obtained from the base of the skull
through the vertex without intravenous contrast.

[Series 3: head wo · axial · 0.43mm/px · z∈[-129,+11]mm · 11 of 34 slices shown, 14 images]
[im 3/34  brain]
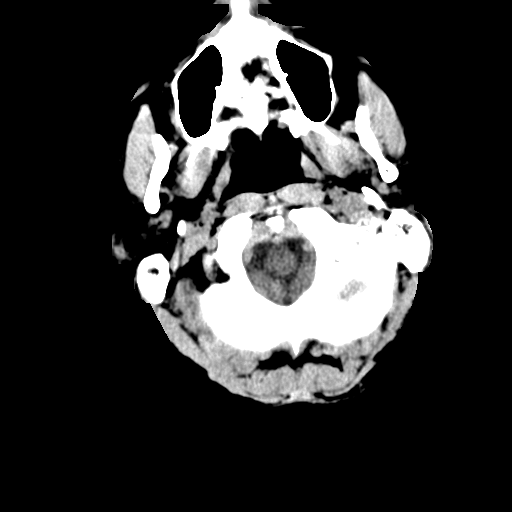
[im 3/34  bone]
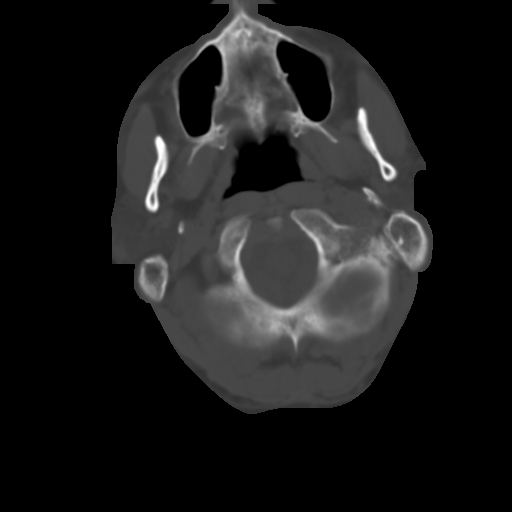
[im 5/34  brain]
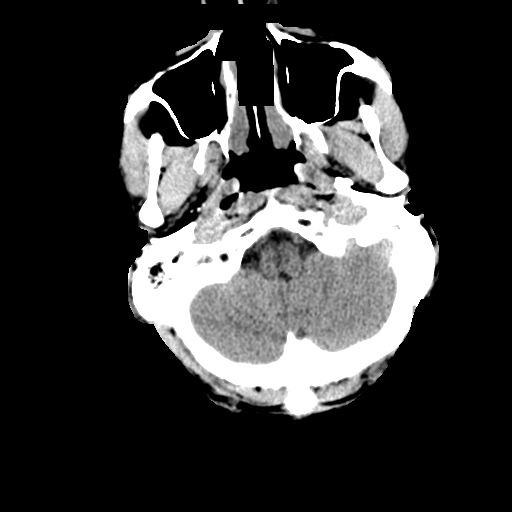
[im 8/34  brain]
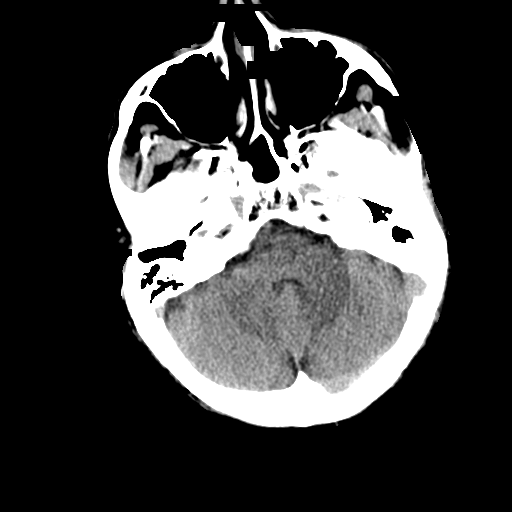
[im 12/34  brain]
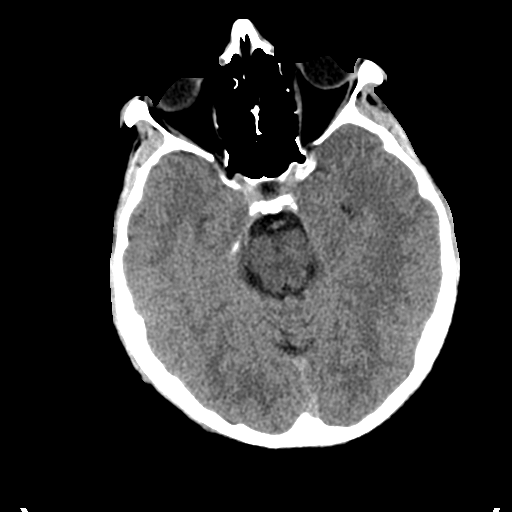
[im 15/34  brain]
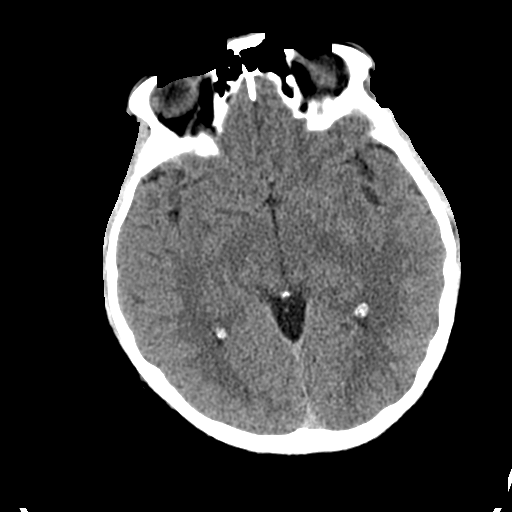
[im 15/34  bone]
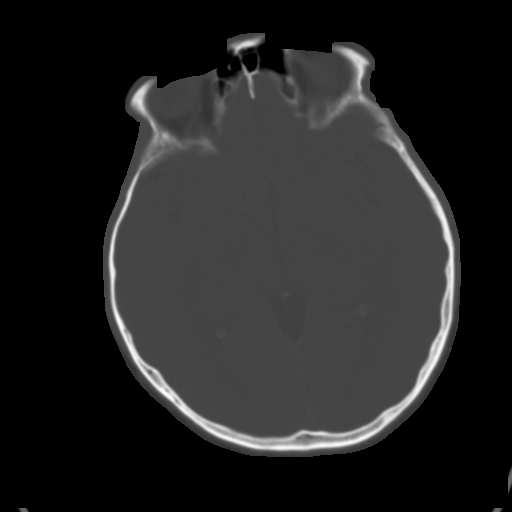
[im 17/34  brain]
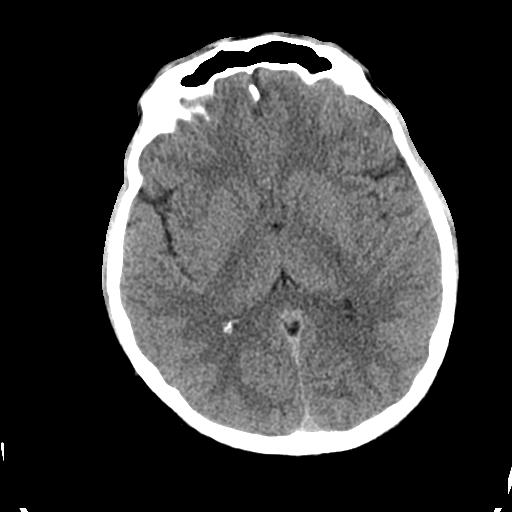
[im 19/34  brain]
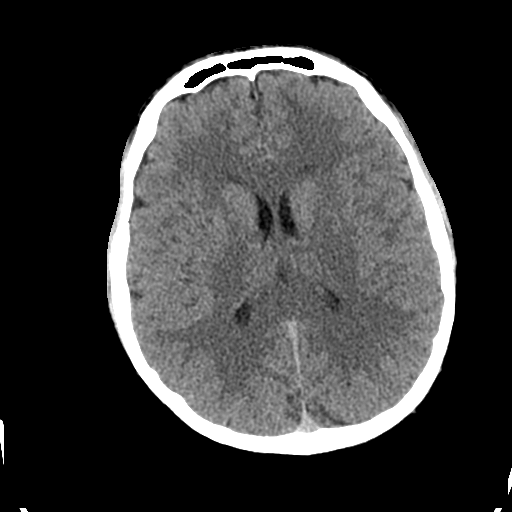
[im 22/34  brain]
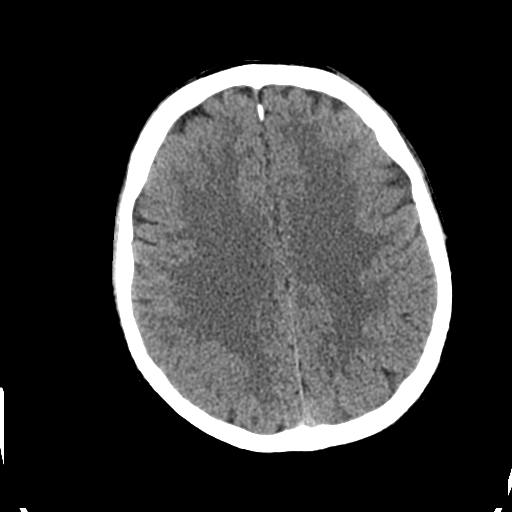
[im 26/34  brain]
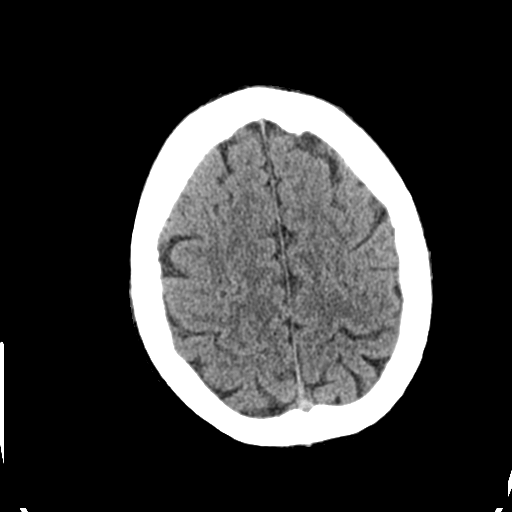
[im 26/34  bone]
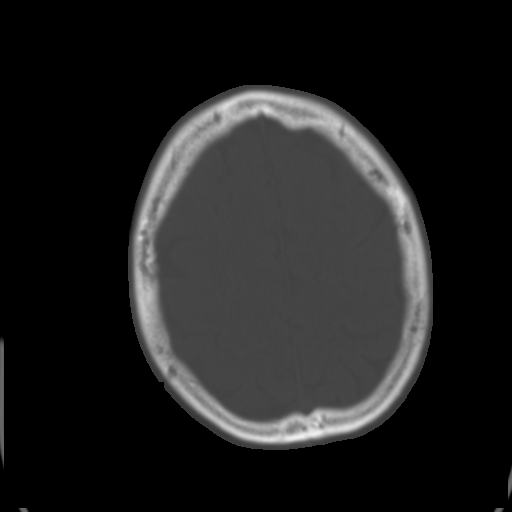
[im 29/34  brain]
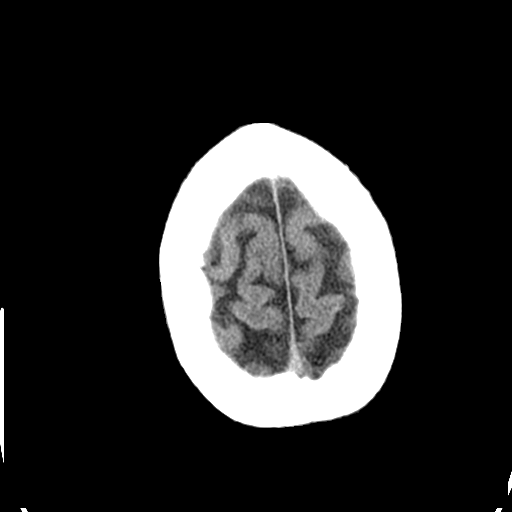
[im 31/34  brain]
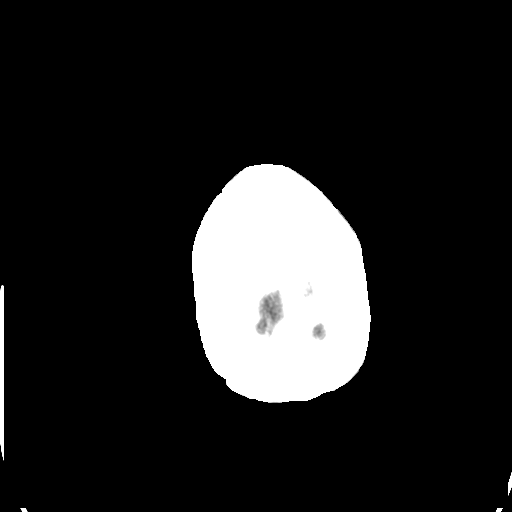

[Series 5: coronal soft tissue · coronal · 0.36mm/px · 3 of 75 slices shown]
[im 29/75  brain]
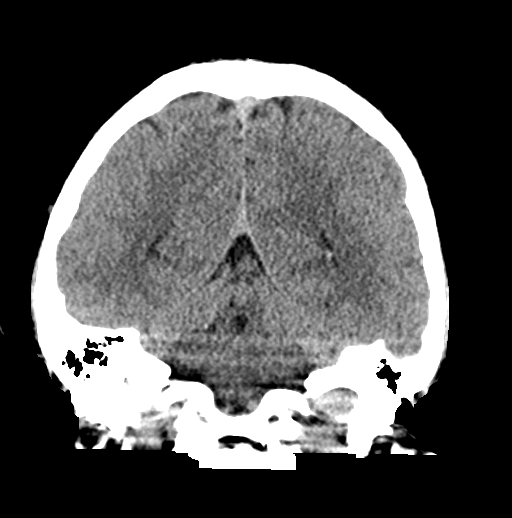
[im 35/75  brain]
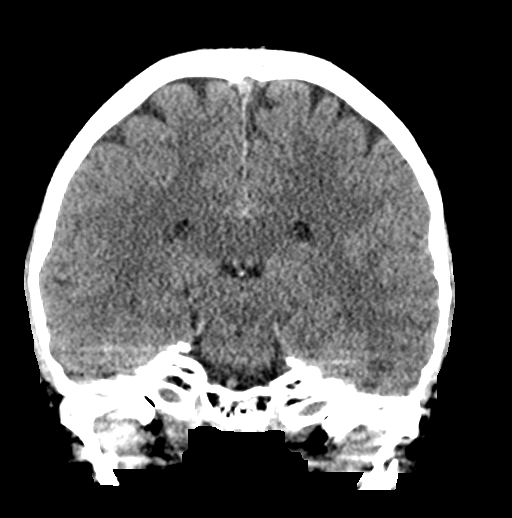
[im 40/75  brain]
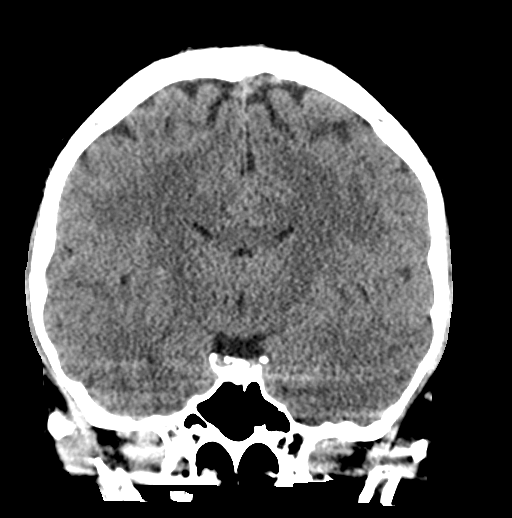

[Series 6: sagittal soft tissue · sagittal · 0.37mm/px · 2 of 61 slices shown]
[im 21/61  brain]
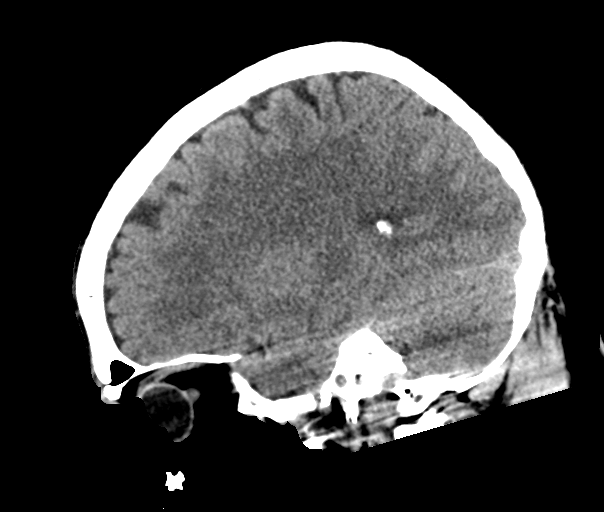
[im 41/61  brain]
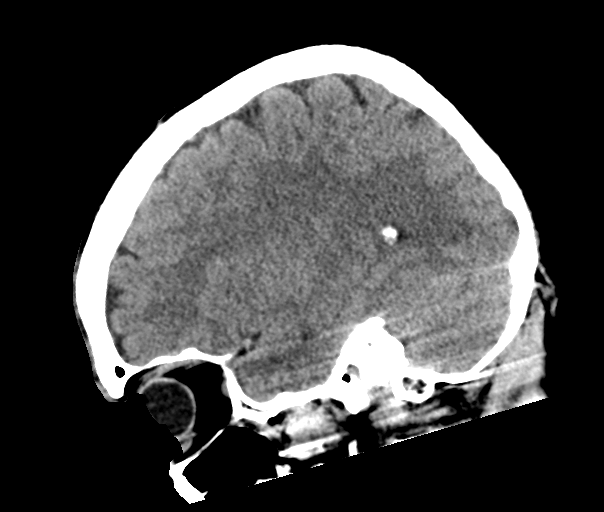

[16 of 47 positions shown; findings below may reference images not displayed]

FINDINGS: Brain: No evidence of acute infarction, hemorrhage, hydrocephalus,
extra-axial collection or mass lesion/mass effect.

Vascular: No hyperdense vessel or unexpected calcification.

Skull: Normal. Negative for fracture or focal lesion.

Sinuses/Orbits: No acute finding.

Other: Negative for scalp hematoma.
IMPRESSION: No acute intracranial findings.

## 2020-10-12 MED ORDER — LORAZEPAM 2 MG/ML IJ SOLN: 1.0000 mg | INTRAMUSCULAR | Status: DC | PRN

## 2020-10-12 MED ORDER — ACETAMINOPHEN 650 MG RE SUPP: 650.0000 mg | Freq: Four times a day (QID) | RECTAL | Status: DC | PRN

## 2020-10-12 MED ORDER — ATORVASTATIN CALCIUM 20 MG PO TABS
10.0000 mg | ORAL_TABLET | Freq: Every day | ORAL | Status: DC
  Administered 2020-10-12: 10 mg via ORAL
  Filled 2020-10-12: qty 1

## 2020-10-12 MED ORDER — LEVETIRACETAM 500 MG PO TABS: 1000.0000 mg | ORAL_TABLET | Freq: Two times a day (BID) | ORAL | Status: DC

## 2020-10-12 MED ORDER — LIDOCAINE-EPINEPHRINE 2 %-1:100000 IJ SOLN
20.0000 mL | Freq: Once | INTRAMUSCULAR | Status: AC
  Administered 2020-10-12: 20 mL via INTRADERMAL
  Filled 2020-10-12: qty 1

## 2020-10-12 MED ORDER — ACETAMINOPHEN 325 MG PO TABS: 650.0000 mg | ORAL_TABLET | Freq: Four times a day (QID) | ORAL | Status: DC | PRN

## 2020-10-12 MED ORDER — LORAZEPAM 2 MG/ML IJ SOLN
1.0000 mg | Freq: Once | INTRAMUSCULAR | Status: AC
  Administered 2020-10-12: 1 mg via INTRAVENOUS

## 2020-10-12 MED ORDER — FENTANYL CITRATE (PF) 100 MCG/2ML IJ SOLN
12.5000 ug | INTRAMUSCULAR | Status: DC | PRN
  Administered 2020-10-12: 25 ug via INTRAVENOUS
  Filled 2020-10-12: qty 2

## 2020-10-12 MED ORDER — TRAZODONE HCL 50 MG PO TABS: 25.0000 mg | ORAL_TABLET | Freq: Every evening | ORAL | Status: DC | PRN

## 2020-10-12 MED ORDER — LEVETIRACETAM IN NACL 1500 MG/100ML IV SOLN
1500.0000 mg | Freq: Once | INTRAVENOUS | Status: AC
  Administered 2020-10-12: 1500 mg via INTRAVENOUS
  Filled 2020-10-12: qty 100

## 2020-10-12 MED ORDER — LEVETIRACETAM IN NACL 1500 MG/100ML IV SOLN
1500.0000 mg | INTRAVENOUS | Status: AC
  Administered 2020-10-12: 1500 mg via INTRAVENOUS
  Filled 2020-10-12: qty 100

## 2020-10-12 MED ORDER — TRAZODONE HCL 50 MG PO TABS: 50.0000 mg | ORAL_TABLET | Freq: Every evening | ORAL | Status: DC | PRN

## 2020-10-12 MED ORDER — ENOXAPARIN SODIUM 40 MG/0.4ML IJ SOSY: 40.0000 mg | PREFILLED_SYRINGE | INTRAMUSCULAR | Status: DC

## 2020-10-12 MED ORDER — LEVETIRACETAM 750 MG PO TABS
750.0000 mg | ORAL_TABLET | Freq: Two times a day (BID) | ORAL | Status: DC
  Filled 2020-10-12: qty 1

## 2020-10-12 MED ORDER — LORAZEPAM 2 MG/ML IJ SOLN
INTRAMUSCULAR | Status: AC
  Administered 2020-10-12: 1 mg
  Filled 2020-10-12: qty 1

## 2020-10-12 MED ORDER — PAROXETINE HCL 20 MG PO TABS
20.0000 mg | ORAL_TABLET | Freq: Every day | ORAL | Status: DC
  Filled 2020-10-12: qty 1

## 2020-10-12 MED ORDER — ASPIRIN EC 81 MG PO TBEC
81.0000 mg | DELAYED_RELEASE_TABLET | Freq: Every day | ORAL | Status: DC
  Administered 2020-10-12: 81 mg via ORAL
  Filled 2020-10-12: qty 1

## 2020-10-12 MED ORDER — ONDANSETRON HCL 4 MG PO TABS: 4.0000 mg | ORAL_TABLET | Freq: Four times a day (QID) | ORAL | Status: DC | PRN

## 2020-10-12 MED ORDER — SODIUM CHLORIDE 0.9 % IV SOLN: INTRAVENOUS | Status: DC

## 2020-10-12 MED ORDER — STROKE: EARLY STAGES OF RECOVERY BOOK: Freq: Once | Status: DC

## 2020-10-12 MED ORDER — SODIUM CHLORIDE 0.9 % IV BOLUS
250.0000 mL | Freq: Once | INTRAVENOUS | Status: AC
  Administered 2020-10-12: 250 mL via INTRAVENOUS

## 2020-10-12 MED ORDER — LORAZEPAM 2 MG/ML IJ SOLN
INTRAMUSCULAR | Status: AC
  Administered 2020-10-12: 1 mg via INTRAVENOUS
  Filled 2020-10-12: qty 1

## 2020-10-12 MED ORDER — MAGNESIUM HYDROXIDE 400 MG/5ML PO SUSP: 30.0000 mL | Freq: Every day | ORAL | Status: DC | PRN

## 2020-10-12 MED ORDER — ONDANSETRON HCL 4 MG/2ML IJ SOLN: 4.0000 mg | Freq: Four times a day (QID) | INTRAMUSCULAR | Status: DC | PRN
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
## 2020-10-12 NOTE — ED Notes (Addendum)
Went into check on pt... he was up out of bed stating "I am leaving I am not staying" Pt has ripped off cardiac leads and ripped out his IV and was dressed. I advised I was calling the MD to come right now.. Pt stated "I am NOT waiting on the damn doctor I am ready to leave".  I tried to stop him but he forced his way past me and his girlfriend who was in the room as well. He refused to sign anything for AMA either,

## 2020-10-12 NOTE — ED Notes (Signed)
PT at bedside.

## 2020-10-12 NOTE — Evaluation (Signed)
Physical Therapy Evaluation Patient Details Name: Nicholas Reese MRN: [id number] DOB: 1981/01/22 Today's Date: 10/12/2020   History of Present Illness  Per MD notes, pt is a 40 y.o. caucasian male who presented to ED after a seizure and laceration to posterior right wrist. PMH includes anxiety, depression and seizure disorder with noncompliance to Keppra. MD assessment inlcudes recurrent seizures likely secondary to noncompliance, left-sided weakness and numbness, anxiety, and depression.  Clinical Impression  Pt was pleasant and willing to participate during the session. Pt exhibited decreased sensation with LUE and LLE compared to right side and some weakness at 4/5 for LLE compared to 5/5 for RLE. Pt exhibited one LOB of balance initially walking requiring min A to correct but was overall steady for rest of walk with no more LOB. Pt reported primary deficit was the increased pain from the laceration to right wrist and that he felt close to baseline function otherwise. Pt will benefit from further OOB activities with PT for the remainder of admission to address generalized weakness. We will keep him on PT caseload to prevent functional decline but he will not need further PT services post d/c.     Follow Up Recommendations No PT follow up;Supervision for mobility/OOB    Equipment Recommendations  None recommended by PT    Recommendations for Other Services       Precautions / Restrictions Precautions Precautions: Fall Restrictions Weight Bearing Restrictions: No      Mobility  Bed Mobility Overal bed mobility: Independent                  Transfers Overall transfer level: Needs assistance Equipment used: Rolling walker (2 wheeled) Transfers: Sit to/from Stand Sit to Stand: Supervision            Ambulation/Gait Ambulation/Gait assistance: Min guard;Min assist Gait Distance (Feet): 200 Feet Assistive device: 1 person hand held assist;None Gait  Pattern/deviations: Step-through pattern;Wide base of support;Drifts right/left Gait velocity: decreased   General Gait Details: Pt began walking with 1 person HHA but progressed to min guard. Pt exhibited 1 LOB after initally walking requiring min A to correct but exhibited no more LOB throughout rest of walk. Pt reported feeling close to baseline walking expect for wrist pain and side effects from medications.  Stairs            Wheelchair Mobility    Modified Rankin (Stroke Patients Only)       Balance Overall balance assessment: Needs assistance Sitting-balance support: Feet supported;No upper extremity supported Sitting balance-Leahy Scale: Normal     Standing balance support: No upper extremity supported;During functional activity Standing balance-Leahy Scale: Good                               Pertinent Vitals/Pain Pain Assessment: 0-10 Pain Score: 10-Worst pain ever Pain Location: Posterior right wrist Pain Descriptors / Indicators: Sore;Sharp Pain Intervention(s): Monitored during session;Limited activity within patient's tolerance    Home Living Family/patient expects to be discharged to:: Private residence Living Arrangements: Children (3 children: 71 y.o., 110 y.o., 93 month old) Available Help at Discharge: Family;Available 24 hours/day Type of Home: House Home Access: Stairs to enter Entrance Stairs-Rails: Can reach both;Left;Right Entrance Stairs-Number of Steps: 4 Home Layout: One level Home Equipment: Crutches;Grab bars - tub/shower;Grab bars - toilet      Prior Function Level of Independence: Independent         Comments: Pt independent with ambulation  and ADLs. Works full time. Reports 10+ falls in the last 6 months.     Hand Dominance   Dominant Hand: Right    Extremity/Trunk Assessment   Upper Extremity Assessment Upper Extremity Assessment: RUE deficits/detail;LUE deficits/detail RUE: Unable to fully assess due to  pain RUE Sensation: WNL LUE Deficits / Details: MMT 5/5 grossly LUE Sensation: decreased light touch    Lower Extremity Assessment Lower Extremity Assessment: RLE deficits/detail;LLE deficits/detail RLE Deficits / Details: MMT 5/5 grossly RLE Sensation: WNL LLE Deficits / Details: MMT 4/5 grossly LLE Sensation: decreased light touch       Communication   Communication: No difficulties  Cognition Arousal/Alertness: Awake/alert Behavior During Therapy: WFL for tasks assessed/performed Overall Cognitive Status: Within Functional Limits for tasks assessed                                        General Comments      Exercises Total Joint Exercises Marching in Standing: AROM;Strengthening;Both;10 reps;Standing   Assessment/Plan    PT Assessment Patient needs continued PT services  PT Problem List Decreased strength;Decreased activity tolerance;Decreased balance;Decreased mobility;Pain       PT Treatment Interventions DME instruction;Gait training;Stair training;Functional mobility training;Therapeutic activities;Therapeutic exercise;Balance training;Patient/family education    PT Goals (Current goals can be found in the Care Plan section)  Acute Rehab PT Goals Patient Stated Goal: Get back home PT Goal Formulation: With patient Time For Goal Achievement: 10/25/20 Potential to Achieve Goals: Good    Frequency Min 2X/week   Barriers to discharge        Co-evaluation               AM-PAC PT "6 Clicks" Mobility  Outcome Measure Help needed turning from your back to your side while in a flat bed without using bedrails?: None Help needed moving from lying on your back to sitting on the side of a flat bed without using bedrails?: None Help needed moving to and from a bed to a chair (including a wheelchair)?: A Little Help needed standing up from a chair using your arms (e.g., wheelchair or bedside chair)?: A Little Help needed to walk in hospital  room?: A Little Help needed climbing 3-5 steps with a railing? : A Little 6 Click Score: 20    End of Session Equipment Utilized During Treatment: Gait belt Activity Tolerance: Patient tolerated treatment well Patient left: in bed;with call bell/phone within reach;with family/visitor present Nurse Communication: Mobility status PT Visit Diagnosis: Unsteadiness on feet (R26.81);Difficulty in walking, not elsewhere classified (R26.2);Muscle weakness (generalized) (M62.81);Pain Pain - Right/Left: Right Pain - part of body: Arm    Time: 1542-1610 PT Time Calculation (min) (ACUTE ONLY): 28 min   Charges:              Desiree Hane SPT 10/12/20, 5:28 PM

## 2020-10-12 NOTE — ED Notes (Signed)
Pt is requesting to leave AMA. I have informed him why he is being admitted. I msg the admitting MD as well.

## 2020-10-12 NOTE — ED Notes (Addendum)
Pt had another witnessed seizure lasting longer at about 1 minute. Another 2 mg of ativan given IV. Pt is not losing control of his bladder during seizure activity.

## 2020-10-12 NOTE — Procedures (Signed)
Patient Name: Nicholas Reese  MRN: [id number]  Epilepsy Attending: Charlsie Quest  Referring Physician/Provider: Dr Milon Dikes Date: 10/12/2020 Duration: 21.28 mins  Patient history: 40 year old with known history of seizure disorder, presenting with a flurry of seizures today, without any prolonged postictal period. EEG to evaluate for seizure  Level of alertness: Awake, drowsy  AEDs during EEG study: LEV  Technical aspects: This EEG study was done with scalp electrodes positioned according to the 10-20 International system of electrode placement. Electrical activity was acquired at a sampling rate of 500Hz  and reviewed with a high frequency filter of 70Hz  and a low frequency filter of 1Hz . EEG data were recorded continuously and digitally stored.   Description: The posterior dominant rhythm consists of 8 Hz activity of moderate voltage (25-35 uV) seen predominantly in posterior head regions, symmetric and reactive to eye opening and eye closing. Drowsiness was characterized by attenuation of the posterior background rhythm. Physiologic photic driving was not seen during photic stimulation.  Hyperventilation was not performed.     IMPRESSION: This study is within normal limits. No seizures or epileptiform discharges were seen throughout the recording.  Jassica Zazueta 

## 2020-10-12 NOTE — Progress Notes (Signed)
Eeg done 

## 2020-10-12 NOTE — ED Notes (Signed)
Looked up and pt is sitting on edge of bed. RN to bedside to redirect him to bed. Pt asking about is cut on his arm and how serious it is and does he need to stay? RN advised yes that neuro wants him to stay for observation. Pt agreed to do so. IV keppra started.

## 2020-10-12 NOTE — ED Notes (Signed)
NIHHS was not performed. Neuro came and completed at bedside. Pt is not a stroke pt but had some weakness in his left leg post seizure so he was evaluated by neuro for weakness and was cleared.

## 2020-10-12 NOTE — ED Notes (Signed)
Pt back from CT. CT tech advised pt had seizure like activity on table but was completely awake and alert during entire event. Pt is now resting in bed texting on his phone.
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
## 2020-10-12 NOTE — ED Notes (Signed)
Called sister to update at request of pt.

## 2020-10-12 NOTE — ED Notes (Signed)
Pt assisted to standing position to use urinal. Then back to bed. Explained again safety of NOT getting up and to call RN for anything.

## 2020-10-12 NOTE — ED Triage Notes (Signed)
Ems was called for pt, pt was at work and had a seizure, pt's right arm went through glass and pt has a lac to the right arm. Pt had another seizure for ems who treated the patient with 2mg  pt has had another seizure on arrival to the ER. Pt's girlfriend spoke with crystal RN regarding his arrival to the ER and having recent medication changes

## 2020-10-12 NOTE — ED Notes (Signed)
Pt seizing again, pt turned onto his left side, 2mg  of ativan administered iv

## 2020-10-12 NOTE — ED Provider Notes (Signed)
..  Laceration Repair  Date/Time: 10/12/2020 2:23 PM Performed by: Shaune Pollack, MD Authorized by: Shaune Pollack, MD   Consent:    Consent obtained:  Verbal   Consent given by:  Patient   Risks, benefits, and alternatives were discussed: yes     Risks discussed:  Pain, poor cosmetic result, nerve damage, poor wound healing, vascular damage, tendon damage, retained foreign body, infection and need for additional repair   Alternatives discussed:  No treatment Universal protocol:    Patient identity confirmed:  Verbally with patient Anesthesia:    Anesthesia method:  Local infiltration   Local anesthetic:  Lidocaine 2% WITH epi Laceration details:    Location:  Shoulder/arm   Shoulder/arm location:  R lower arm   Length (cm):  3   Depth (mm):  4 Pre-procedure details:    Preparation:  Patient was prepped and draped in usual sterile fashion and imaging obtained to evaluate for foreign bodies Exploration:    Imaging outcome: foreign body not noted     Wound exploration: wound explored through full range of motion   Treatment:    Area cleansed with:  Povidone-iodine   Amount of cleaning:  Extensive   Irrigation solution:  Sterile saline   Irrigation method:  Pressure wash   Undermining:  Minimal   Layers/structures repaired:  Deep subcutaneous Deep subcutaneous:    Suture size:  5-0 and 4-0   Suture material:  Monocryl   Number of sutures:  1 Skin repair:    Repair method:  Sutures   Suture size:  5-0 and 4-0   Suture material:  Nylon   Suture technique:  Simple interrupted   Number of sutures:  8 Approximation:    Approximation:  Close Post-procedure details:    Dressing:  Antibiotic ointment and non-adherent dressing   Procedure completion:  Tolerated Comments:     Wound explored through full range of motion with no apparent underlying vascular or tendon injuries. Marland Kitchen.Laceration Repair  Date/Time: 10/12/2020 3:25 PM Performed by: Shaune Pollack, MD Authorized by:  Shaune Pollack, MD   Consent:    Consent obtained:  Verbal   Consent given by:  Patient   Risks discussed:  Infection, need for additional repair, pain, tendon damage, retained foreign body, vascular damage, poor cosmetic result, poor wound healing and nerve damage   Alternatives discussed:  Referral and delayed treatment Anesthesia:    Anesthesia method:  Local infiltration   Local anesthetic:  Lidocaine 1% WITH epi Laceration details:    Length (cm):  1.5 Pre-procedure details:    Preparation:  Patient was prepped and draped in usual sterile fashion and imaging obtained to evaluate for foreign bodies Exploration:    Hemostasis achieved with:  Direct pressure   Wound exploration: wound explored through full range of motion   Treatment:    Area cleansed with:  Betadine   Amount of cleaning:  Extensive   Irrigation solution:  Sterile water   Irrigation method:  Pressure wash Skin repair:    Repair method:  Sutures   Suture size:  4-0   Suture material:  Prolene and nylon   Suture technique:  Simple interrupted   Number of sutures:  4 Approximation:    Approximation:  Close Repair type:    Repair type:  Simple Post-procedure details:    Dressing:  Antibiotic ointment   Procedure completion:  Tolerated well, no immediate complications    Shaune Pollack, MD 10/12/20 1526

## 2020-10-12 NOTE — Consult Note (Signed)
Neurology Consultation  Reason for Consult: seizures Referring Physician: multiple seizures  CC: seizures  History is obtained from:patient and chart  HPI: Nicholas Reese is a 40 y.o. male PMH of seizures, anxiety and depression, on Keppra 500 BID with recent missed doses, brought in by EMS from work, for seizures. At work this morning, works with glass, was working and felt that he is going to have a seizure, came down the ladder and had a seizure.  Brought in by EMS and evaluated in the emergency room where he had continued seizure activity off-and-on requiring multiple doses of Ativan-total of 5 seizures reported.  After coming around, he had some weakness in the left leg that she reports that he does get that a lot of times after his seizures. Went for stat CT-showed no acute intracranial findings but while in the scanner, had another brief seizure episode.  During these episodes, he turns onto his left side and has tonic-clonic activity followed by a very short period of lethargy and comes around. During my evaluation, he was awake alert oriented x3. Reports ongoing personal stressors. Reports he has missed 1 or maybe few more doses of Keppra.  Prior EEG in our system with bitemporal dysfunction   ROS: Full ROS was performed and is negative except as noted in the HPI.  Past Medical History:  Diagnosis Date   Anxiety    Depression    Seizures (HCC)     Family History  Problem Relation Age of Onset   COPD Mother    Heart disease Father     Social History:   reports that he has quit smoking. His smoking use included cigarettes. He smoked an average of .1 packs per day. He has never used smokeless tobacco. He reports that he does not drink alcohol and does not use drugs.  Medications No current facility-administered medications for this encounter.  Current Outpatient Medications:    acetaminophen (TYLENOL) 500 MG tablet, Take 500 mg by mouth every 6 (six) hours as  needed., Disp: , Rfl:    levETIRAcetam (KEPPRA) 1000 MG tablet, Take 1 tablet (1,000 mg total) by mouth 2 (two) times daily., Disp: 60 tablet, Rfl: 2   PARoxetine (PAXIL) 20 MG tablet, Take 1 tablet (20 mg total) by mouth daily., Disp: 30 tablet, Rfl: 1   traZODone (DESYREL) 50 MG tablet, Take 1 tablet (50 mg total) by mouth at bedtime as needed and may repeat dose one time if needed for sleep., Disp: 30 tablet, Rfl: 1   Exam: Current vital signs: BP 109/60   Pulse 96   Temp 98 F (36.7 C) (Oral)   Resp (!) 23   Ht 5\' 10"  (1.778 m)   Wt 72.6 kg   SpO2 100%   BMI 22.96 kg/m  Vital signs in last 24 hours: Temp:  [98 F (36.7 C)] 98 F (36.7 C) (08/22 1012) Pulse Rate:  [96-100] 96 (08/22 1030) Resp:  [18-23] 23 (08/22 1030) BP: (109-119)/(60-77) 109/60 (08/22 1030) SpO2:  [100 %] 100 % (08/22 1030) Weight:  [72.6 kg] 72.6 kg (08/22 1013) General: Awake alert in no distress HEENT: Normocephalic/atraumatic, no tongue bite. Lungs: Clear Cardiovascular: Regular rhythm Abdomen nondistended nontender Neurological exam Awake alert oriented x3 Speech is nondysarthric No evidence of aphasia Cranial 2-12 intact Motor examination with left leg weakness 3/5 at the hip and distally.  Otherwise 5/5. Sensation also mildly diminished on the left leg, otherwise intact. Coordination: No dysmetria in the upper extremities.  Unable to  perform heel-knee-shin on the left leg.  Normal on the right.    Labs I have reviewed labs in epic and the results pertinent to this consultation are:   CBC    Component Value Date/Time   WBC 7.3 10/12/2020 1004   RBC 3.86 (L) 10/12/2020 1004   HGB 13.6 10/12/2020 1004   HGB 14.5 01/24/2013 1618   HCT 38.4 (L) 10/12/2020 1004   HCT 42.0 01/24/2013 1618   PLT 214 10/12/2020 1004   PLT 251 01/24/2013 1618   MCV 99.5 10/12/2020 1004   MCV 87 01/24/2013 1618   MCH 35.2 (H) 10/12/2020 1004   MCHC 35.4 10/12/2020 1004   RDW 12.2 10/12/2020 1004   RDW  13.2 01/24/2013 1618   LYMPHSABS 1.1 10/12/2020 1004   MONOABS 0.2 10/12/2020 1004   EOSABS 0.0 10/12/2020 1004   BASOSABS 0.0 10/12/2020 1004    CMP     Component Value Date/Time   NA 137 10/12/2020 1004   NA 138 01/24/2013 1618   K 3.8 10/12/2020 1004   K 3.3 (L) 01/24/2013 1618   CL 107 10/12/2020 1004   CL 105 01/24/2013 1618   CO2 24 10/12/2020 1004   CO2 25 01/24/2013 1618   GLUCOSE 115 (H) 10/12/2020 1004   GLUCOSE 114 (H) 01/24/2013 1618   BUN 13 10/12/2020 1004   BUN 9 01/24/2013 1618   CREATININE 1.09 10/12/2020 1004   CREATININE 1.15 01/24/2013 1618   CALCIUM 8.9 10/12/2020 1004   CALCIUM 8.3 (L) 01/24/2013 1618   PROT 7.1 10/12/2020 1004   PROT 7.3 08/21/2012 1943   ALBUMIN 4.5 10/12/2020 1004   ALBUMIN 4.2 08/21/2012 1943   AST 22 10/12/2020 1004   AST 24 08/21/2012 1943   ALT 14 10/12/2020 1004   ALT 24 08/21/2012 1943   ALKPHOS 53 10/12/2020 1004   ALKPHOS 79 08/21/2012 1943   BILITOT 0.8 10/12/2020 1004   BILITOT 0.3 08/21/2012 1943   GFRNONAA >60 10/12/2020 1004   GFRNONAA >60 01/24/2013 1618   GFRAA >60 08/04/2019 1756   GFRAA >60 01/24/2013 1618    Lipid Panel     Component Value Date/Time   CHOL 182 08/05/2019 0407   TRIG 216 (H) 08/05/2019 0407   HDL 31 (L) 08/05/2019 0407   CHOLHDL 5.9 08/05/2019 0407   VLDL 43 (H) 08/05/2019 0407   LDLCALC 108 (H) 08/05/2019 0407     Imaging I have reviewed the images obtained:  CT-head-no acute changes   Assessment:  40 year old with known history of seizure disorder, presenting with a flurry of seizures today, without any prolonged postictal period.  No evidence of tongue bite or bowel/bladder incontinence.  Reports missing some doses of Keppra and this prior preceding few days. Exam remarkable for left leg weakness-question postictal Todd's paralysis Suspect breakthrough seizures. Prior EEG-intermittent slow left and right temporal region. Clinically does not appear to be in status  epilepticus at this time, but given the flurry of seizures, I think might benefit from overnight observation and an EEG  Recommendations: -Already given Keppra 1500 IV x1.  Give additional Keppra 1500 IV x1. -Increase home dose of Keppra to 750 twice daily. -Blood has been collected prior to Keppra administration-added Keppra level to that. -Check urinary toxicology screen -Maintain seizure precautions -EEG-ordered -If he remains seizure-free for the next 24 hours or so, he can be discharged home with outpatient follow-up. Discussed my plan with ED provider Dr. Dorothea Glassman  -- Milon Dikes, MD Neurologist [facility] Pager: [phone]

## 2020-10-12 NOTE — Progress Notes (Signed)
The patient's brain MRI without contrast came back negative for CVA or acute intracranial abnormalities..  The patient wanted to leave AMA earlier and got convinced by his girlfriend to stay.  He later on repeat of his cardiac leads and his IVs and refused to sign AMA.  I was notified by his nurse and anxiety once getting ready to go see the patient right after being informed, I was notified that the patient has already left and would refuse to talk to the doctor.  He is aware of risks involved any at insisted on leaving AGAINST MEDICAL ADVICE from the ER before being admitted to a progressive unit bed.

## 2020-10-12 NOTE — ED Notes (Signed)
Pt gone to MRI 

## 2020-10-14 LAB — LEVETIRACETAM LEVEL: Levetiracetam Lvl: <1 ug/mL — ABNORMAL LOW (ref 10.0–40.0)

## 2021-01-02 ENCOUNTER — Emergency Department: Payer: Self-pay

## 2021-01-02 ENCOUNTER — Other Ambulatory Visit: Payer: Self-pay

## 2021-01-02 ENCOUNTER — Emergency Department
Admission: EM | Admit: 2021-01-02 | Discharge: 2021-01-03 | Disposition: A | Discharge status: Home / Self Care | Payer: Self-pay | Attending: Emergency Medicine | Admitting: Emergency Medicine

## 2021-01-02 DIAGNOSIS — R079 Chest pain, unspecified: Secondary | ICD-10-CM

## 2021-01-02 DIAGNOSIS — R569 Unspecified convulsions: Secondary | ICD-10-CM | POA: Insufficient documentation

## 2021-01-02 DIAGNOSIS — Z79899 Other long term (current) drug therapy: Secondary | ICD-10-CM | POA: Insufficient documentation

## 2021-01-02 DIAGNOSIS — K920 Hematemesis: Secondary | ICD-10-CM | POA: Insufficient documentation

## 2021-01-02 DIAGNOSIS — Z87891 Personal history of nicotine dependence: Secondary | ICD-10-CM | POA: Insufficient documentation

## 2021-01-02 DIAGNOSIS — K921 Melena: Secondary | ICD-10-CM | POA: Insufficient documentation

## 2021-01-02 LAB — CBC
HCT: 40.5 % (ref 39.0–52.0)
Hemoglobin: 13.6 g/dL (ref 13.0–17.0)
MCH: 33.3 pg (ref 26.0–34.0)
MCHC: 33.6 g/dL (ref 30.0–36.0)
MCV: 99.3 fL (ref 80.0–100.0)
Platelets: 331 10*3/uL (ref 150–400)
RBC: 4.08 MIL/uL — ABNORMAL LOW (ref 4.22–5.81)
RDW: 12.3 % (ref 11.5–15.5)
WBC: 6.2 10*3/uL (ref 4.0–10.5)
nRBC: 0 % (ref 0.0–0.2)

## 2021-01-02 LAB — BASIC METABOLIC PANEL
Anion gap: 6 (ref 5–15)
BUN: 14 mg/dL (ref 6–20)
CO2: 20 mmol/L — ABNORMAL LOW (ref 22–32)
Calcium: 8.9 mg/dL (ref 8.9–10.3)
Chloride: 110 mmol/L (ref 98–111)
Creatinine, Ser: 1.75 mg/dL — ABNORMAL HIGH (ref 0.61–1.24)
GFR, Estimated: 49 mL/min — ABNORMAL LOW (ref >60)
Glucose, Bld: 114 mg/dL — ABNORMAL HIGH (ref 70–99)
Potassium: 3.5 mmol/L (ref 3.5–5.1)
Sodium: 136 mmol/L (ref 135–145)

## 2021-01-02 LAB — LIPASE, BLOOD: Lipase: 40 U/L (ref 11–51)

## 2021-01-02 LAB — TROPONIN I (HIGH SENSITIVITY): Troponin I (High Sensitivity): <2 ng/L (ref <18)

## 2021-01-02 IMAGING — DX DG CHEST 1V
1 series · 1 of 1 positions shown · non-contrast
Comparison: [DATE]

CLINICAL DATA: Seizure

EXAM:
CHEST  1 VIEW

[chest ap]
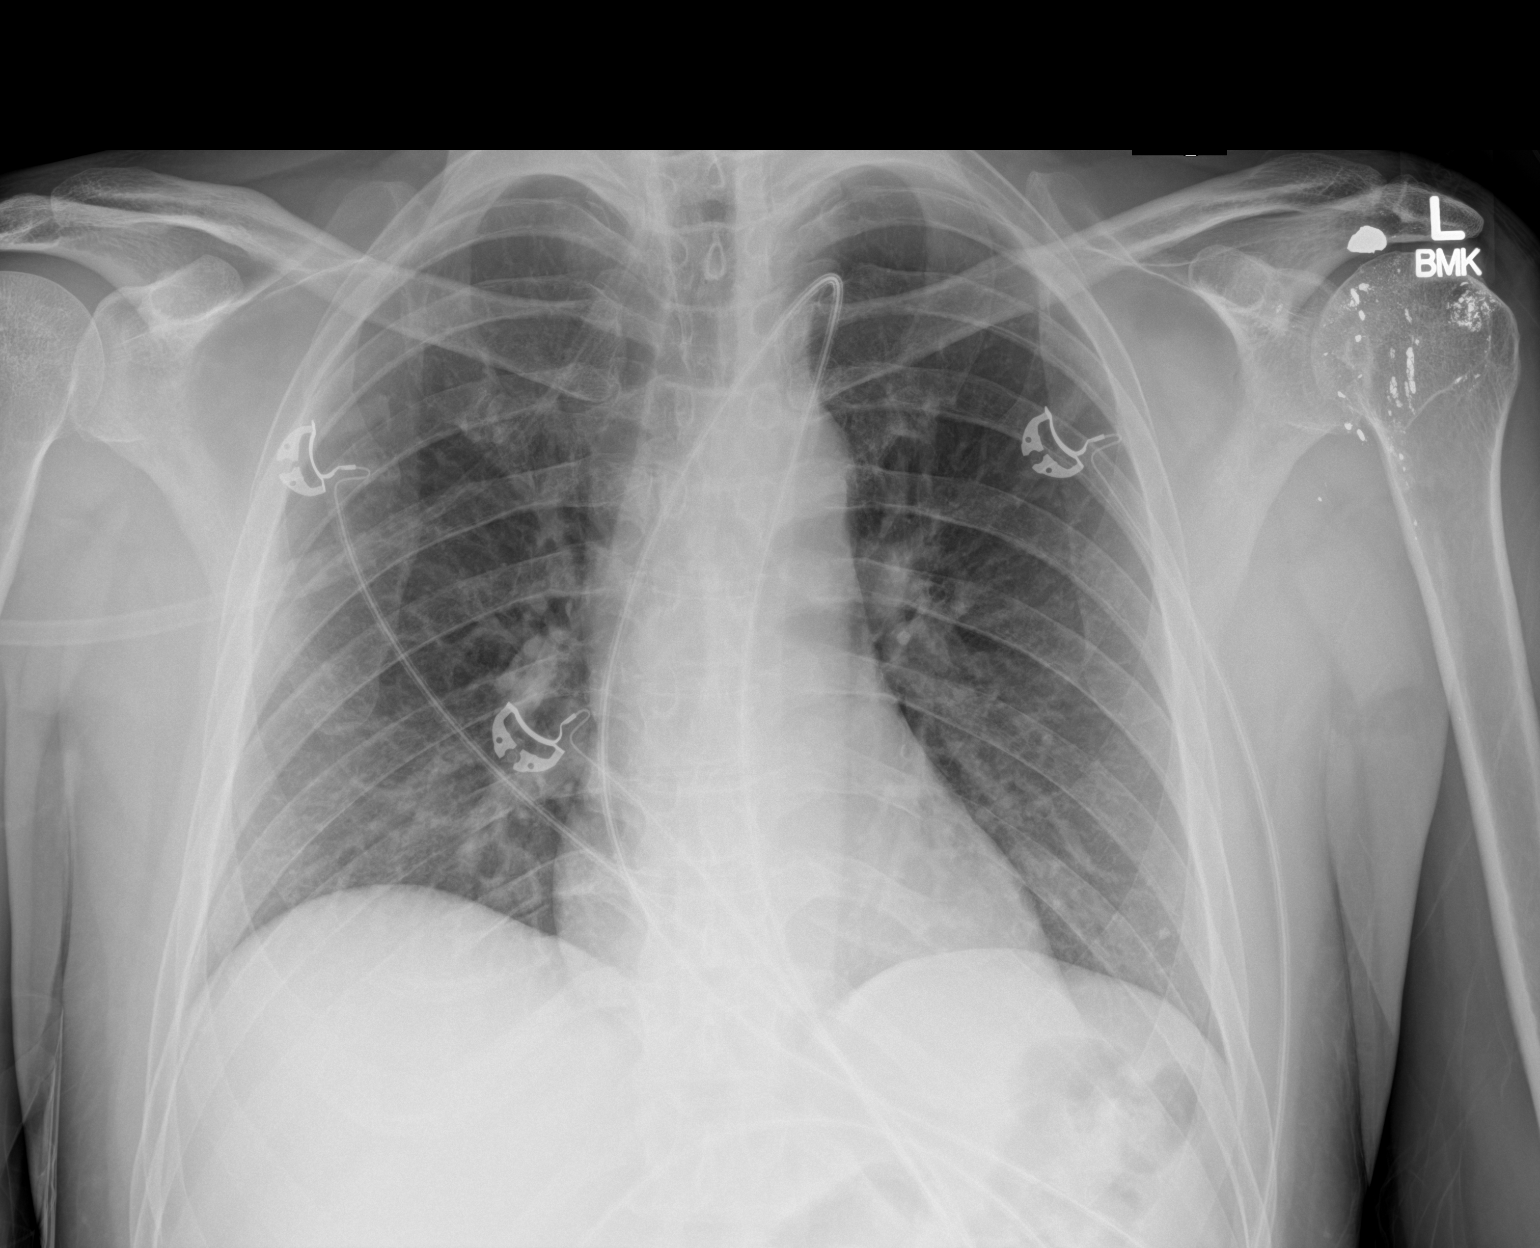

[1 of 1 positions shown; findings below may reference images not displayed]

FINDINGS: Lungs are clear.  No pleural effusion or pneumothorax.

The heart is normal in size.

Shrapnel overlying the left shoulder.
IMPRESSION: No evidence of acute cardiopulmonary disease.

## 2021-01-02 IMAGING — CT CT ABD-PELV W/ CM
2 of 5 series · 15 of 46 positions shown, 17 images · IV contrast (APPLIED)
Comparison: None.

CLINICAL DATA: Melena, hematemesis, seizure

EXAM:
CT ABDOMEN AND PELVIS WITH CONTRAST
TECHNIQUE: Multidetector CT imaging of the abdomen and pelvis was performed
using the standard protocol following bolus administration of
intravenous contrast.
CONTRAST:  80mL OMNIPAQUE IOHEXOL 300 MG/ML  SOLN

[Series 2: routine abd/pel with · axial · 0.79mm/px · z∈[-1097,-597]mm · 12 of 112 slices shown, 14 images]
[im 6/112  soft-tissue]
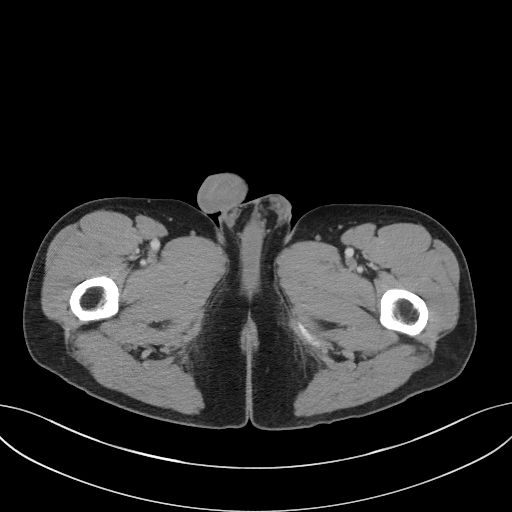
[im 6/112  bone]
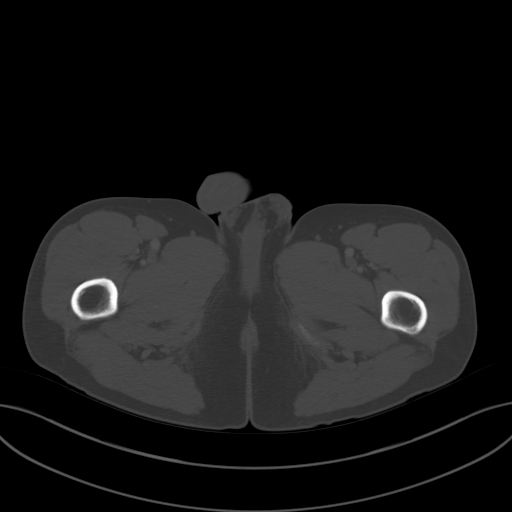
[im 18/112  soft-tissue]
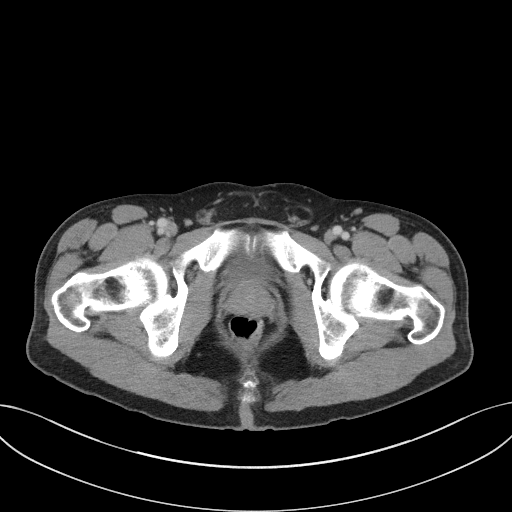
[im 24/112  soft-tissue]
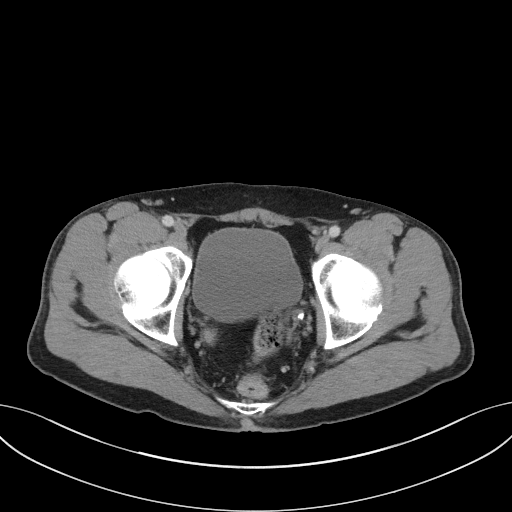
[im 36/112  soft-tissue]
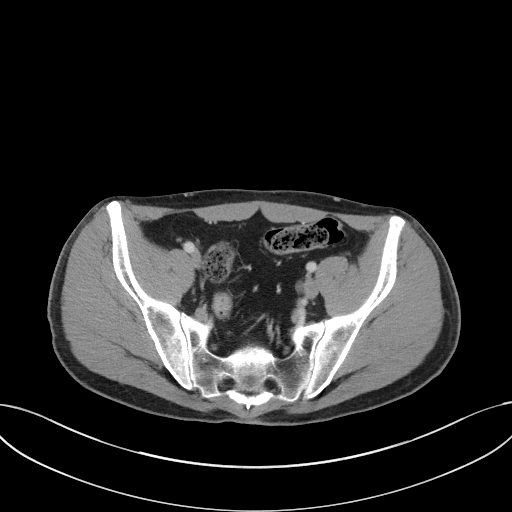
[im 41/112  soft-tissue]
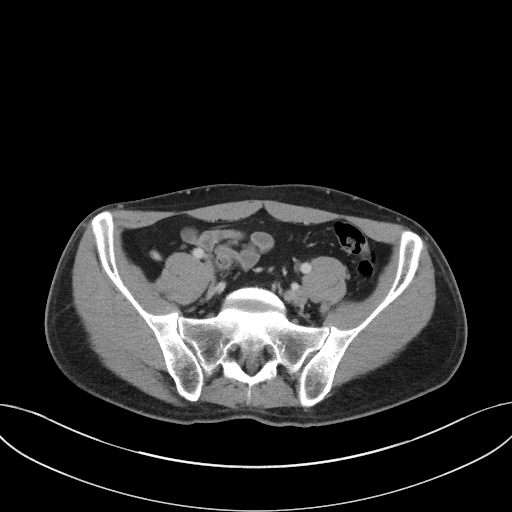
[im 53/112  soft-tissue]
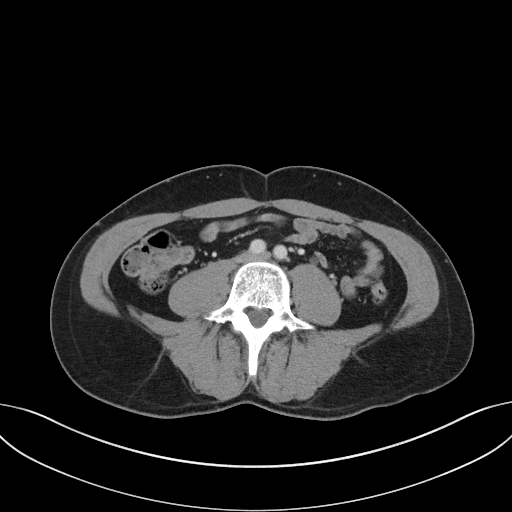
[im 59/112  soft-tissue]
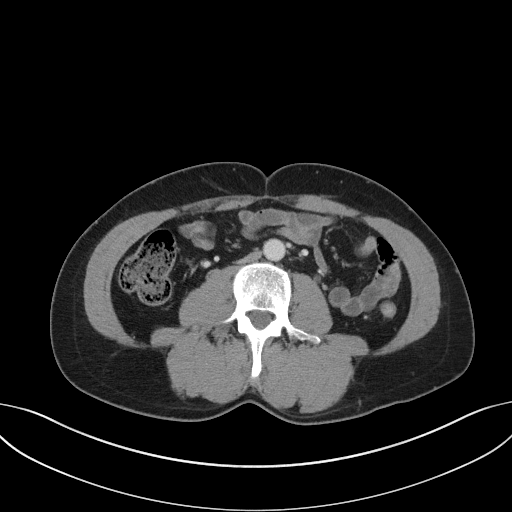
[im 71/112  soft-tissue]
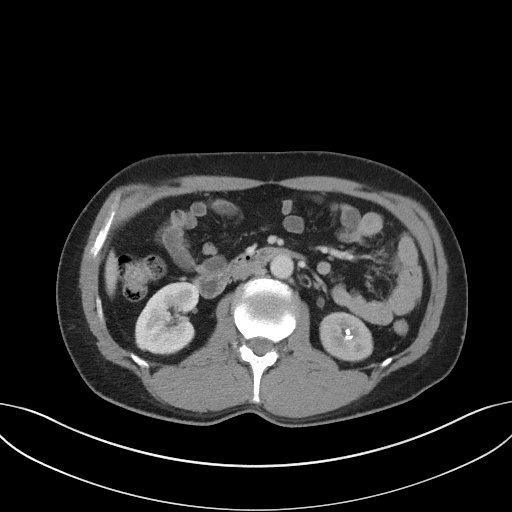
[im 76/112  soft-tissue]
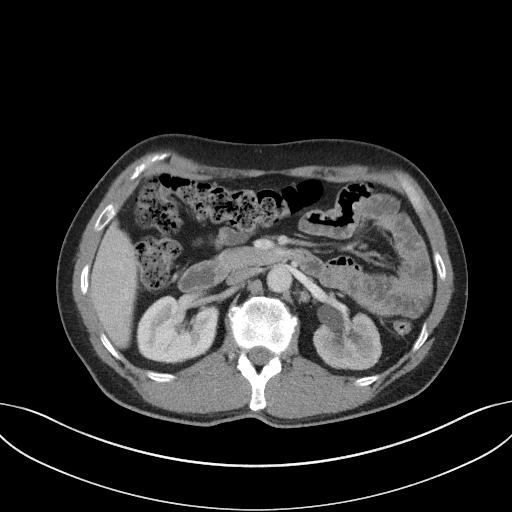
[im 76/112  bone]
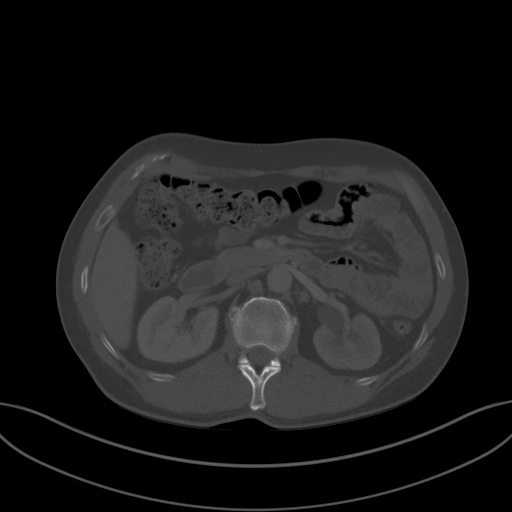
[im 88/112  soft-tissue]
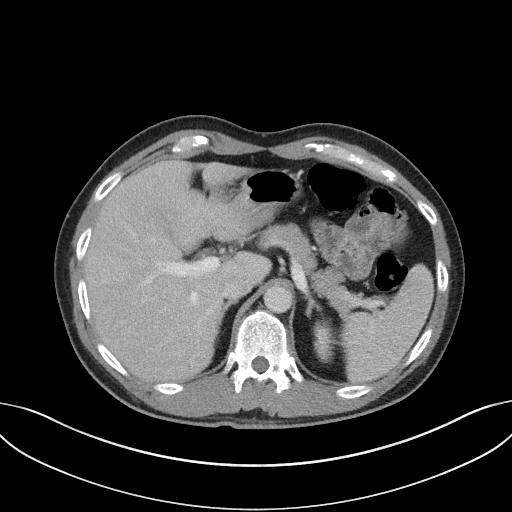
[im 94/112  soft-tissue]
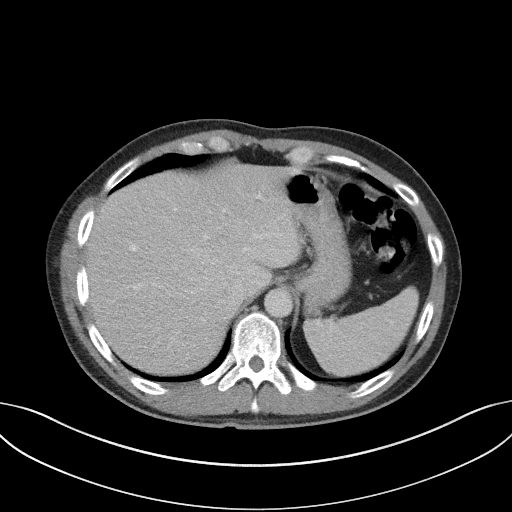
[im 106/112  soft-tissue]
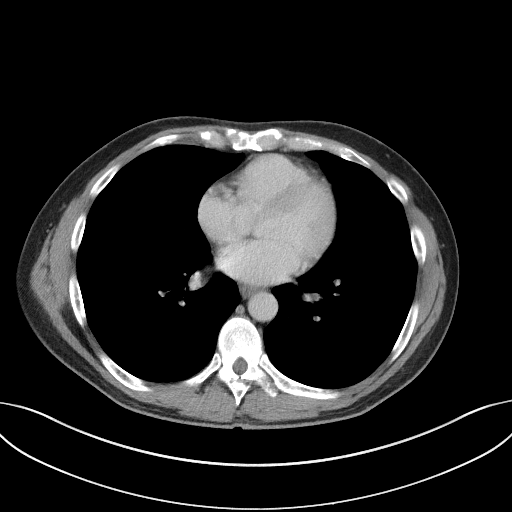

[Series 5: coronal st · coronal · 0.75mm/px · 3 of 86 slices shown]
[im 29/86  soft-tissue]
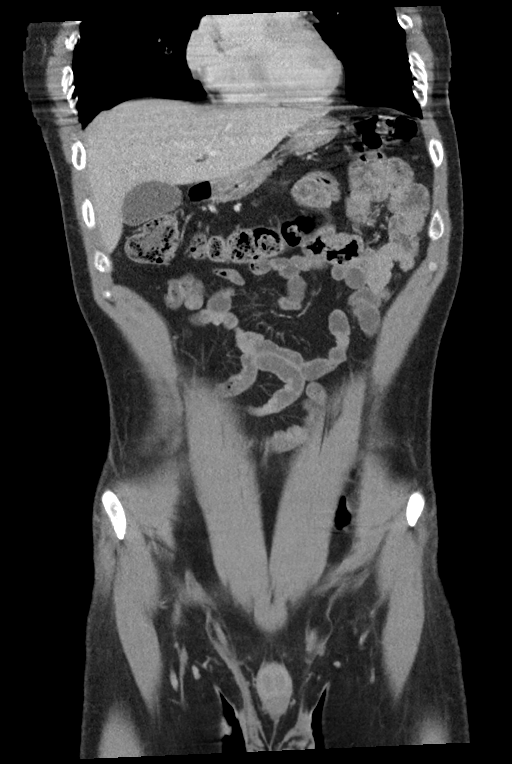
[im 38/86  soft-tissue]
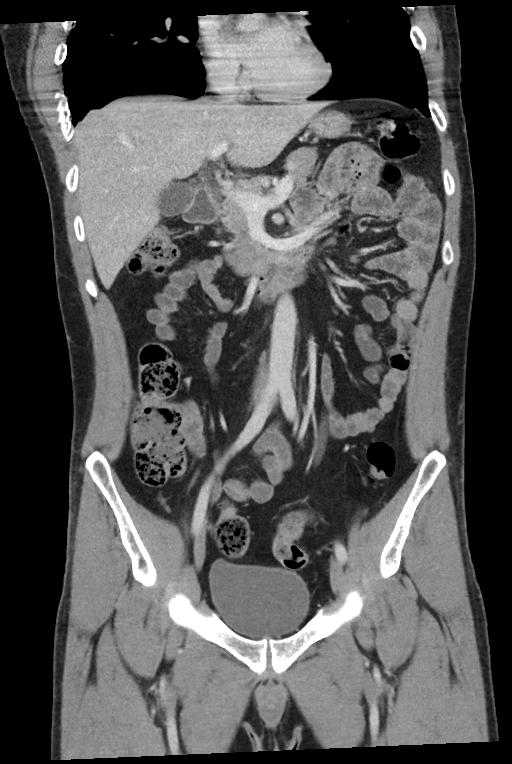
[im 48/86  soft-tissue]
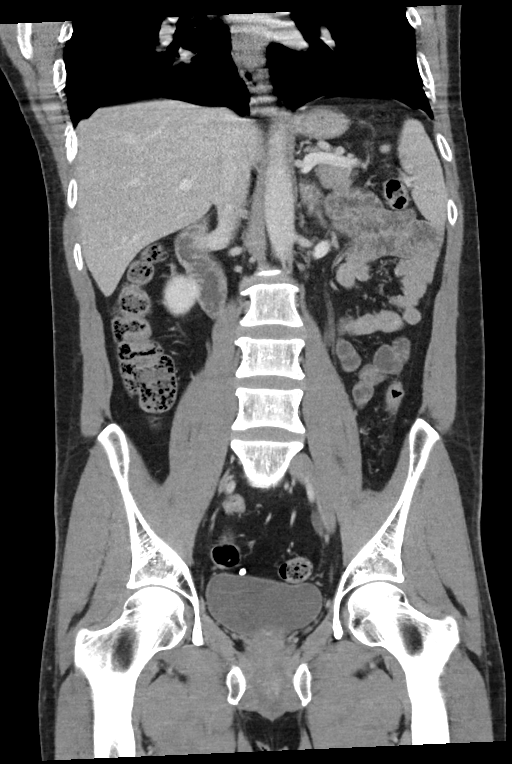

[15 of 46 positions shown; findings below may reference images not displayed]

FINDINGS: Lower chest: No acute abnormality.

Hepatobiliary: No focal liver abnormality is seen. No gallstones,
gallbladder wall thickening, or biliary dilatation.

Pancreas: Unremarkable

Spleen: Unremarkable

Adrenals/Urinary Tract: The adrenal glands are unremarkable. The
kidneys are normal in size and position. Slightly delayed left renal
cortical enhancement. Mild left hydronephrosis secondary to 3
obstructing calculi within the distal left ureter just proximal to
the ureterovesicular junction measuring 5-6 mm in dimension.
Superimposed bilateral nonobstructing nephrolithiasis with multiple
bilateral renal calculi measuring up to 5 mm in greatest dimension.
No ureteral calculi on the right. No hydronephrosis on the right.
The bladder is unremarkable.

Stomach/Bowel: Stomach is within normal limits. Appendix appears
normal. No evidence of bowel wall thickening, distention, or
inflammatory changes. No free intraperitoneal gas or fluid.

Vascular/Lymphatic: Circumaortic left renal vein. The abdominal
vasculature is otherwise unremarkable. No pathologic adenopathy
within the abdomen and pelvis.

Reproductive: Prostate is unremarkable.

Other: Tiny fat containing left inguinal hernia.

Musculoskeletal: No acute bone abnormality. No lytic or blastic bone
lesion. Degenerative changes are seen at the lumbosacral junction.
IMPRESSION: At least 3 separate 5-6 mm obstructing calculi within the distal
left ureter resulting in mild left hydronephrosis. Mildly delayed
left renal cortical enhancement secondary to obstructive uropathy.
Superimposed mild bilateral nonobstructing nephrolithiasis.

## 2021-01-02 MED ORDER — IOHEXOL 300 MG/ML  SOLN
80.0000 mL | Freq: Once | INTRAMUSCULAR | Status: AC | PRN
  Administered 2021-01-02: 80 mL via INTRAVENOUS

## 2021-01-02 MED ORDER — LEVETIRACETAM 500 MG PO TABS: 1000.0000 mg | ORAL_TABLET | Freq: Two times a day (BID) | ORAL | 2 refills | Status: DC

## 2021-01-02 MED ORDER — LEVETIRACETAM IN NACL 1500 MG/100ML IV SOLN
1500.0000 mg | Freq: Once | INTRAVENOUS | Status: AC
Start: 2021-01-02 — End: 2021-01-03
  Administered 2021-01-02: 1500 mg via INTRAVENOUS
  Filled 2021-01-02: qty 100

## 2021-01-02 MED ORDER — LACTATED RINGERS IV BOLUS
1000.0000 mL | Freq: Once | INTRAVENOUS | Status: AC
  Administered 2021-01-02: 1000 mL via INTRAVENOUS

## 2021-01-02 MED ORDER — LORAZEPAM 2 MG/ML IJ SOLN
INTRAMUSCULAR | Status: AC
  Administered 2021-01-02: 2 mg
  Filled 2021-01-02: qty 1

## 2021-01-02 MED ORDER — LEVETIRACETAM IN NACL 1500 MG/100ML IV SOLN
1500.0000 mg | Freq: Once | INTRAVENOUS | Status: AC
  Administered 2021-01-02: 1500 mg via INTRAVENOUS
  Filled 2021-01-02: qty 100

## 2021-01-02 MED ORDER — LEVETIRACETAM 500 MG PO TABS
1000.0000 mg | ORAL_TABLET | Freq: Two times a day (BID) | ORAL | 2 refills | Status: DC
  Filled 2021-01-02: qty 120, 30d supply, fill #0

## 2021-01-02 NOTE — ED Notes (Signed)
Xray tech in room for portable xray. Pt able to follow commands at this time.

## 2021-01-02 NOTE — ED Notes (Signed)
Pt had 30 sec seizure during triage, post-ictal for a few minutes. Now with GCS 13. Moving to room 7. IV started, blood collected
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
## 2021-01-02 NOTE — ED Triage Notes (Signed)
Pt in after 2 seizures today, hx of them, and has not been compliant with his meds. Pt also reports he usually gets sick after his seizures, had one episode of bright red emesis this morning. Also reporting dark stools x few days, w/LLQ pain. C/o some L chest wall pain

## 2021-01-03 LAB — TROPONIN I (HIGH SENSITIVITY): Troponin I (High Sensitivity): 3 ng/L (ref <18)

## 2021-01-04 ENCOUNTER — Other Ambulatory Visit: Payer: Self-pay

## 2021-03-29 ENCOUNTER — Other Ambulatory Visit: Payer: Self-pay

## 2021-04-29 ENCOUNTER — Other Ambulatory Visit: Payer: Self-pay

## 2021-04-29 ENCOUNTER — Emergency Department: Payer: Self-pay

## 2021-04-29 ENCOUNTER — Encounter: Payer: Self-pay | Admitting: Emergency Medicine

## 2021-04-29 ENCOUNTER — Observation Stay
Admission: EM | Admit: 2021-04-29 | Discharge: 2021-04-30 | Disposition: A | Discharge status: Home / Self Care | Payer: Self-pay | Attending: Internal Medicine | Admitting: Internal Medicine

## 2021-04-29 DIAGNOSIS — Z79899 Other long term (current) drug therapy: Secondary | ICD-10-CM | POA: Insufficient documentation

## 2021-04-29 DIAGNOSIS — R569 Unspecified convulsions: Secondary | ICD-10-CM

## 2021-04-29 DIAGNOSIS — Y92009 Unspecified place in unspecified non-institutional (private) residence as the place of occurrence of the external cause: Secondary | ICD-10-CM

## 2021-04-29 DIAGNOSIS — G40909 Epilepsy, unspecified, not intractable, without status epilepticus: Principal | ICD-10-CM | POA: Insufficient documentation

## 2021-04-29 DIAGNOSIS — Z87891 Personal history of nicotine dependence: Secondary | ICD-10-CM | POA: Insufficient documentation

## 2021-04-29 DIAGNOSIS — W19XXXA Unspecified fall, initial encounter: Secondary | ICD-10-CM

## 2021-04-29 DIAGNOSIS — F418 Other specified anxiety disorders: Secondary | ICD-10-CM

## 2021-04-29 DIAGNOSIS — I4891 Unspecified atrial fibrillation: Secondary | ICD-10-CM | POA: Insufficient documentation

## 2021-04-29 DIAGNOSIS — M25512 Pain in left shoulder: Secondary | ICD-10-CM | POA: Insufficient documentation

## 2021-04-29 DIAGNOSIS — Z20822 Contact with and (suspected) exposure to covid-19: Secondary | ICD-10-CM | POA: Insufficient documentation

## 2021-04-29 LAB — CBC WITH DIFFERENTIAL/PLATELET
Abs Immature Granulocytes: 0.02 10*3/uL (ref 0.00–0.07)
Basophils Absolute: 0 10*3/uL (ref 0.0–0.1)
Basophils Relative: 1 %
Eosinophils Absolute: 0 10*3/uL (ref 0.0–0.5)
Eosinophils Relative: 1 %
HCT: 45.1 % (ref 39.0–52.0)
Hemoglobin: 14.4 g/dL (ref 13.0–17.0)
Immature Granulocytes: 0 %
Lymphocytes Relative: 25 %
Lymphs Abs: 1.6 10*3/uL (ref 0.7–4.0)
MCH: 32.2 pg (ref 26.0–34.0)
MCHC: 31.9 g/dL (ref 30.0–36.0)
MCV: 100.9 fL — ABNORMAL HIGH (ref 80.0–100.0)
Monocytes Absolute: 0.3 10*3/uL (ref 0.1–1.0)
Monocytes Relative: 5 %
Neutro Abs: 4.5 10*3/uL (ref 1.7–7.7)
Neutrophils Relative %: 68 %
Platelets: 302 10*3/uL (ref 150–400)
RBC: 4.47 MIL/uL (ref 4.22–5.81)
RDW: 12.6 % (ref 11.5–15.5)
WBC: 6.5 10*3/uL (ref 4.0–10.5)
nRBC: 0 % (ref 0.0–0.2)

## 2021-04-29 LAB — MRSA NEXT GEN BY PCR, NASAL: MRSA by PCR Next Gen: NOT DETECTED

## 2021-04-29 LAB — COMPREHENSIVE METABOLIC PANEL
ALT: 18 U/L (ref 0–44)
AST: 24 U/L (ref 15–41)
Albumin: 4.4 g/dL (ref 3.5–5.0)
Alkaline Phosphatase: 59 U/L (ref 38–126)
Anion gap: 9 (ref 5–15)
BUN: 11 mg/dL (ref 6–20)
CO2: 21 mmol/L — ABNORMAL LOW (ref 22–32)
Calcium: 9 mg/dL (ref 8.9–10.3)
Chloride: 105 mmol/L (ref 98–111)
Creatinine, Ser: 1.16 mg/dL (ref 0.61–1.24)
GFR, Estimated: >60 mL/min (ref >60)
Glucose, Bld: 105 mg/dL — ABNORMAL HIGH (ref 70–99)
Potassium: 4.3 mmol/L (ref 3.5–5.1)
Sodium: 135 mmol/L (ref 135–145)
Total Bilirubin: 0.8 mg/dL (ref 0.3–1.2)
Total Protein: 7.4 g/dL (ref 6.5–8.1)

## 2021-04-29 LAB — RESP PANEL BY RT-PCR (FLU A&B, COVID) ARPGX2
Influenza A by PCR: NEGATIVE
Influenza B by PCR: NEGATIVE
SARS Coronavirus 2 by RT PCR: NEGATIVE

## 2021-04-29 LAB — CBG MONITORING, ED: Glucose-Capillary: 95 mg/dL (ref 70–99)

## 2021-04-29 LAB — MAGNESIUM: Magnesium: 1.9 mg/dL (ref 1.7–2.4)

## 2021-04-29 IMAGING — CT CT HEAD W/O CM
4 series · 16 of 47 positions shown, 18 images · non-contrast
Comparison: Brain MRI [DATE], CT head [DATE]

CLINICAL DATA: Seizure this morning



[Series 2: head bone · axial · 0.41mm/px · z∈[-66,-32]mm · 3 of 85 slices shown]
[im 9/85  bone]
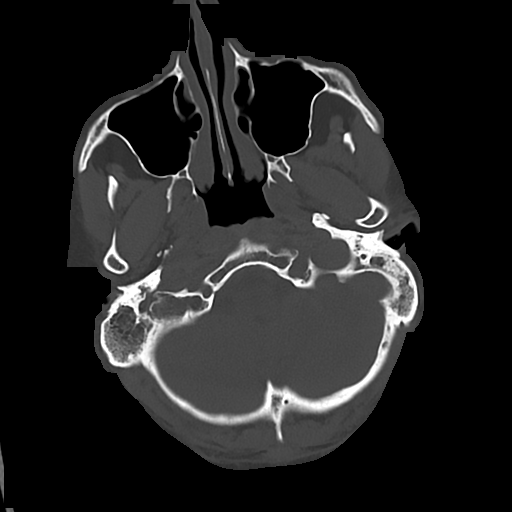
[im 17/85  bone]
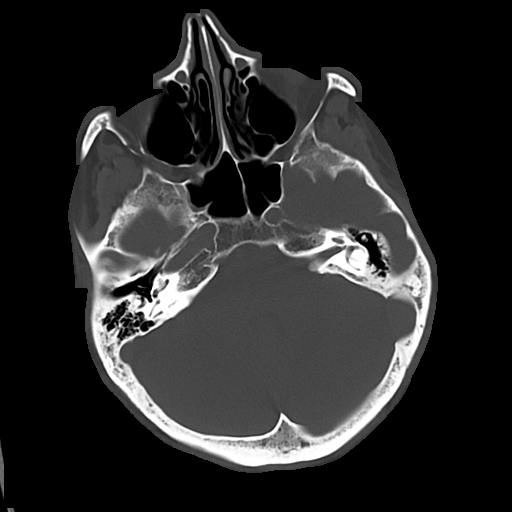
[im 26/85  bone]
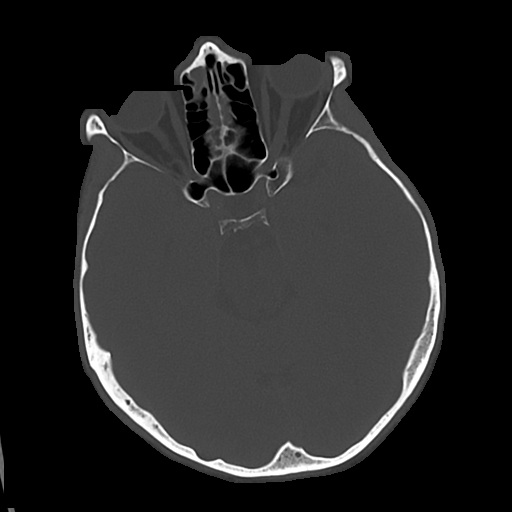

[Series 3: head wo · axial · 0.41mm/px · z∈[-62,+58]mm · 7 of 34 slices shown, 9 images]
[im 5/34  brain]
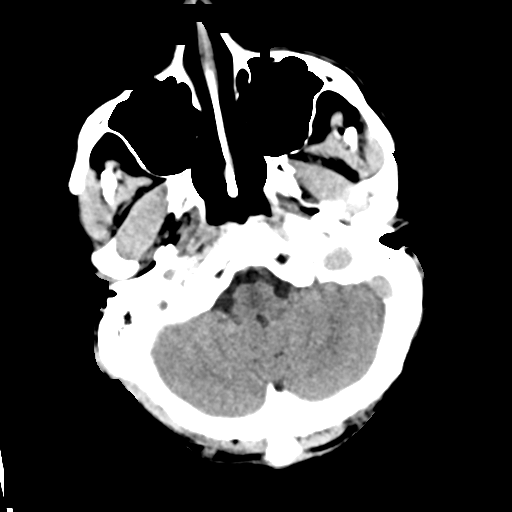
[im 5/34  bone]
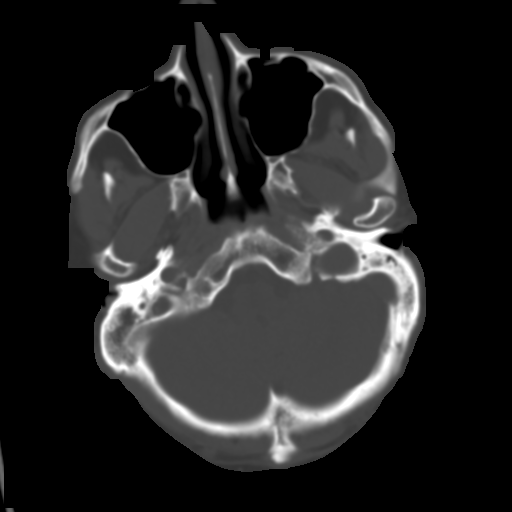
[im 9/34  brain]
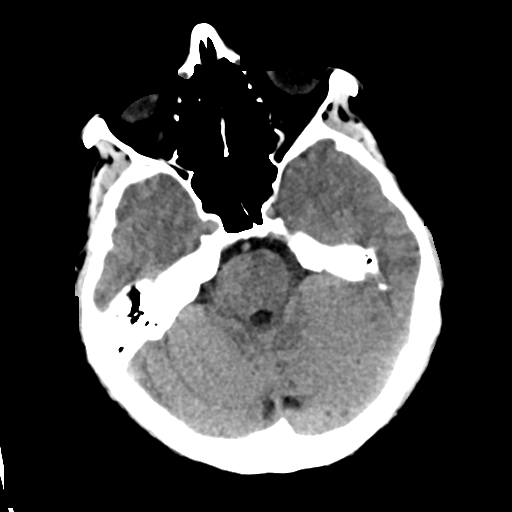
[im 13/34  brain]
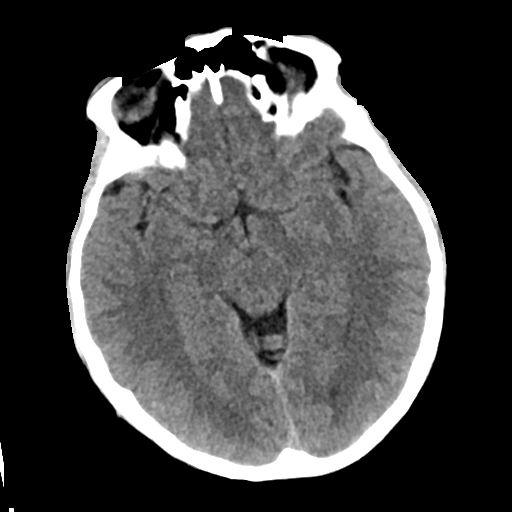
[im 17/34  brain]
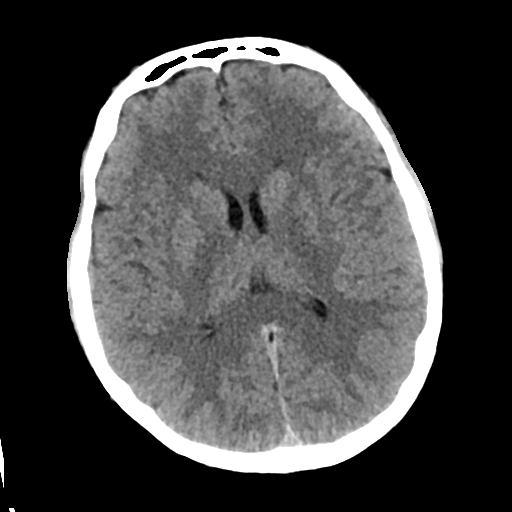
[im 21/34  brain]
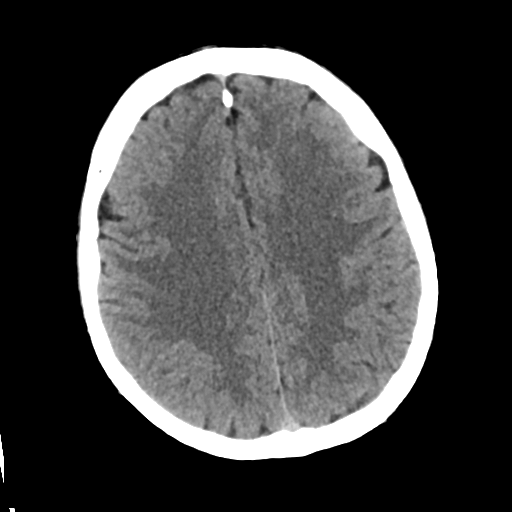
[im 21/34  bone]
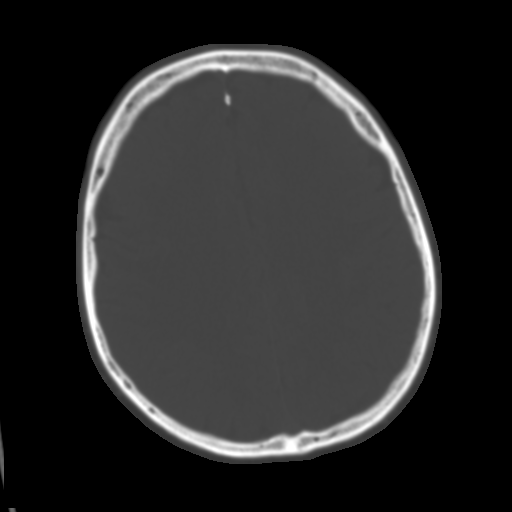
[im 25/34  brain]
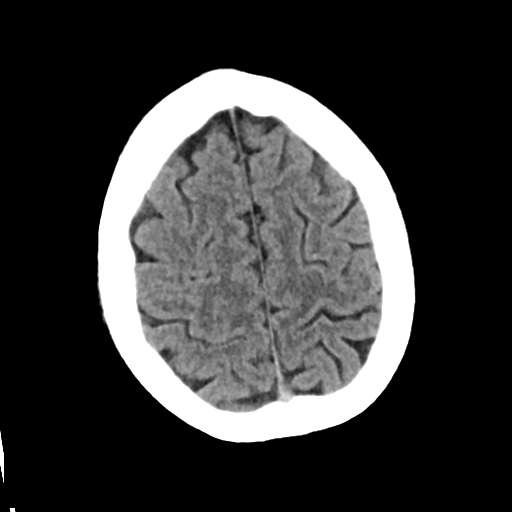
[im 29/34  brain]
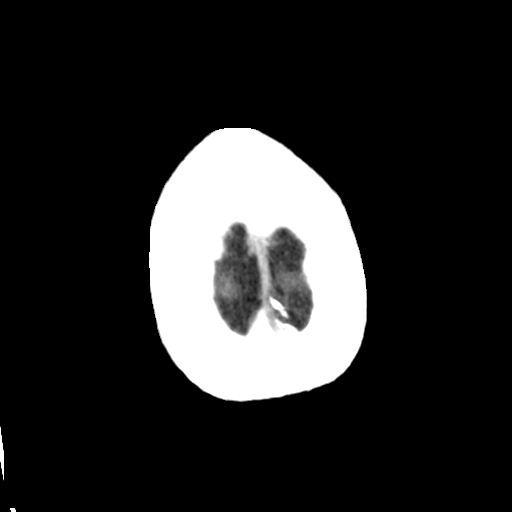

[Series 4: cor soft · coronal · 0.33mm/px · 3 of 66 slices shown]
[im 22/66  brain]
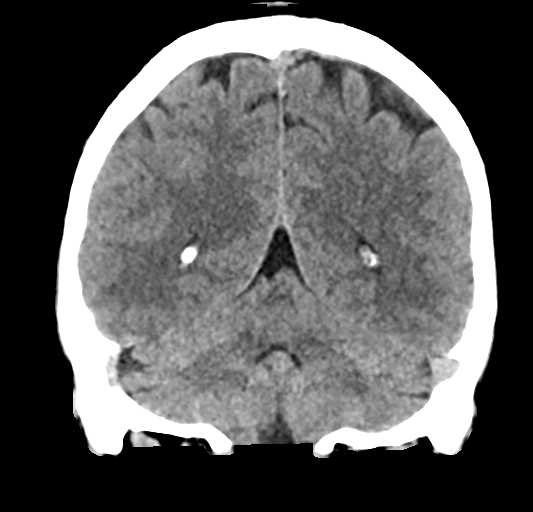
[im 29/66  brain]
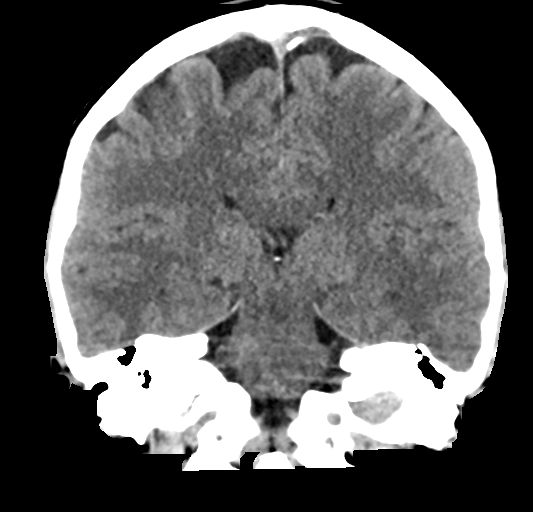
[im 37/66  brain]
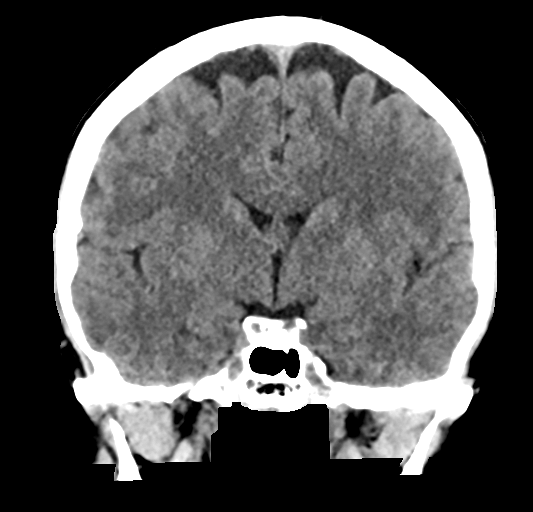

[Series 5: sag soft · sagittal · 0.33mm/px · 3 of 57 slices shown]
[im 19/57  brain]
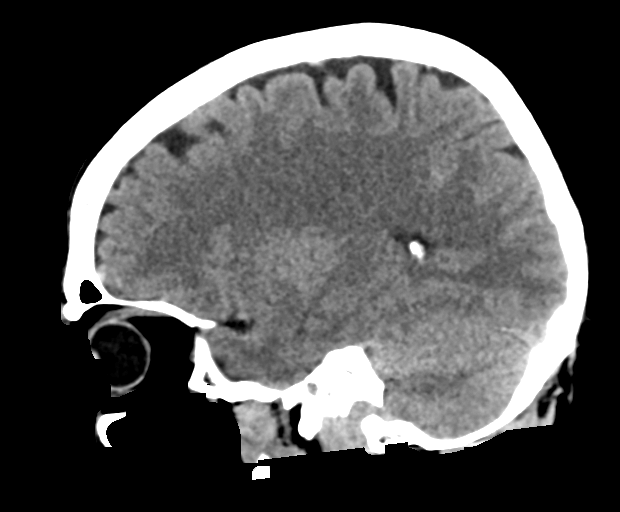
[im 29/57  brain]
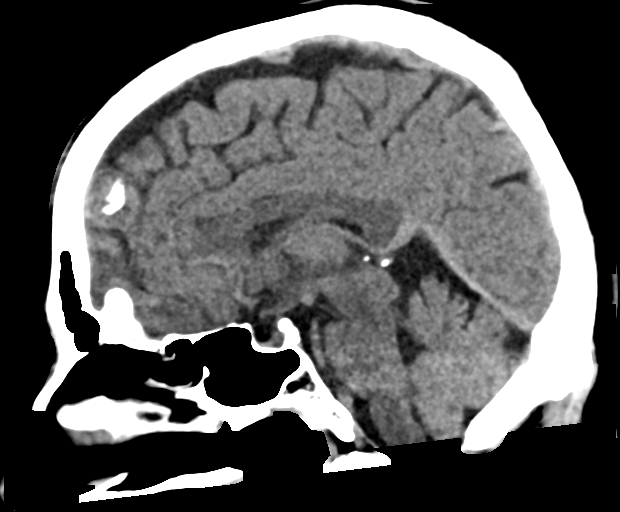
[im 38/57  brain]
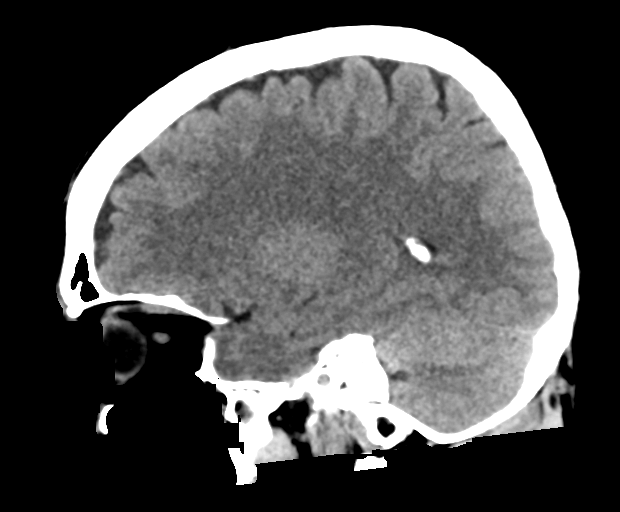

[16 of 47 positions shown; findings below may reference images not displayed]

FINDINGS: Brain: There is no evidence of acute intracranial hemorrhage,
extra-axial fluid collection, or acute infarct.

Parenchymal volume is normal. The ventricles are normal in size.
Gray-white differentiation is preserved.

There is no mass lesion.  There is no mass effect or midline shift.

Vascular: No hyperdense vessel or unexpected calcification.

Skull: Normal. Negative for fracture or focal lesion.

Sinuses/Orbits: The imaged paranasal sinuses are clear. The globes
and orbits are unremarkable.

Other: None.
IMPRESSION: Normal head CT.

## 2021-04-29 IMAGING — CT CT CERVICAL SPINE W/O CM
3 of 4 series · 13 of 33 positions shown, 16 images · non-contrast
Comparison: Cervical spine CT [DATE]

CLINICAL DATA: Seizure this morning, left shoulder pain



[Series 7: sag bone · sagittal · 0.36mm/px · 5 of 65 slices shown, 6 images]
[im 22/65  bone]
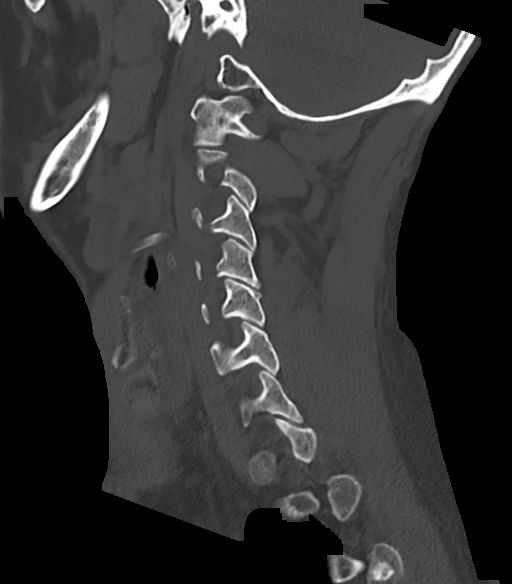
[im 27/65  bone]
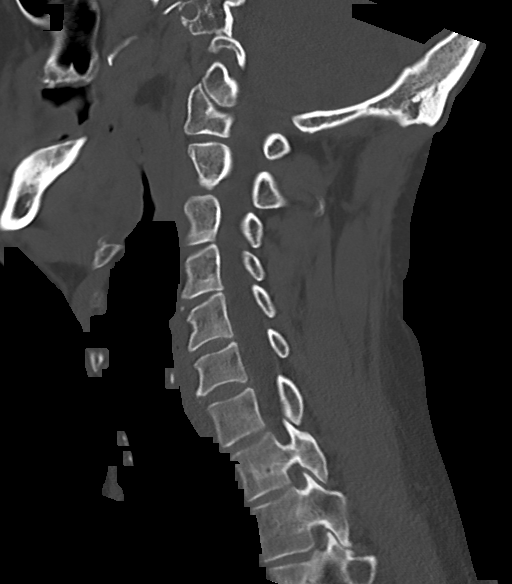
[im 33/65  soft-tissue]
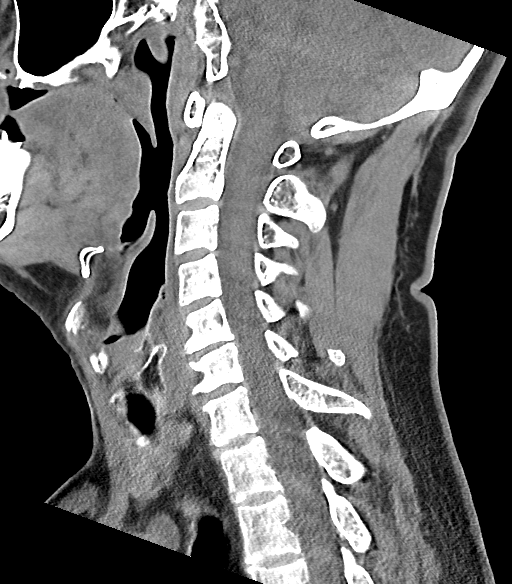
[im 33/65  bone]
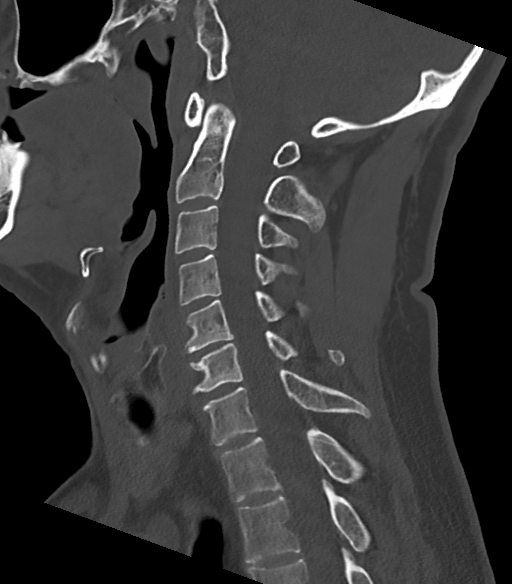
[im 38/65  bone]
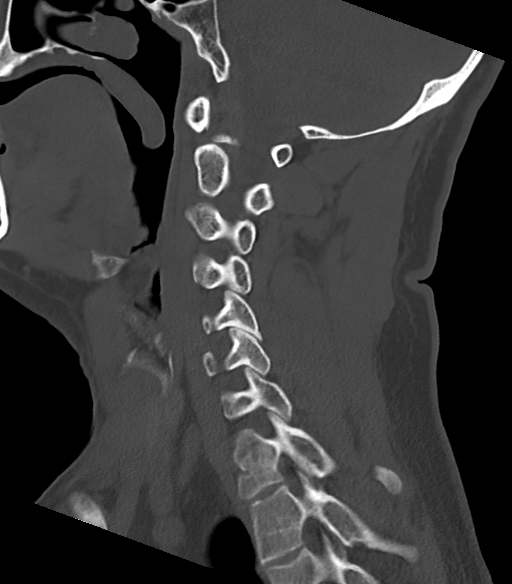
[im 43/65  bone]
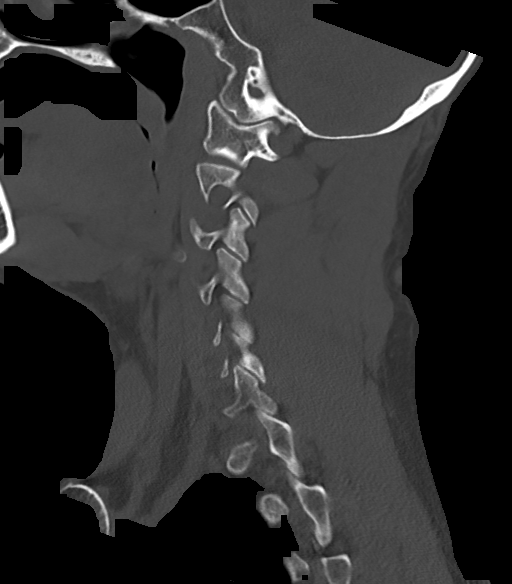

[Series 8: cor bone · coronal · 0.25mm/px · 3 of 70 slices shown]
[im 14/70  bone]
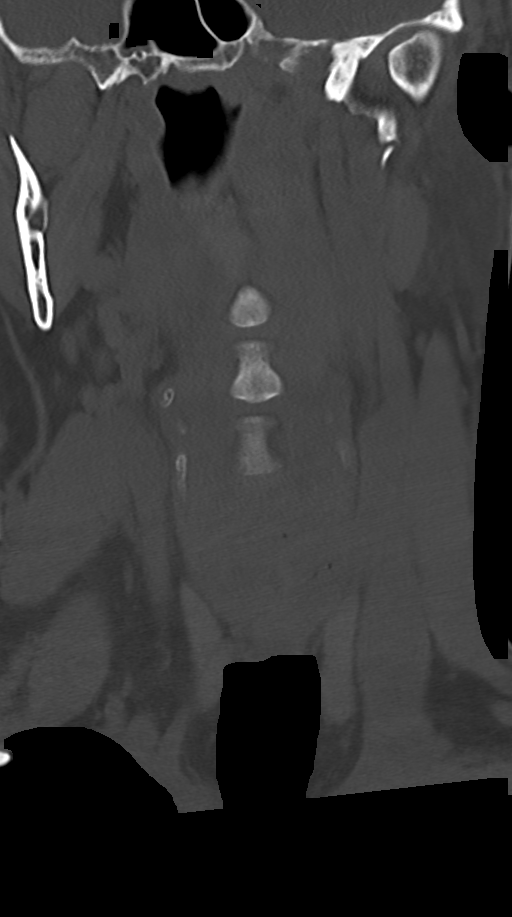
[im 28/70  bone]
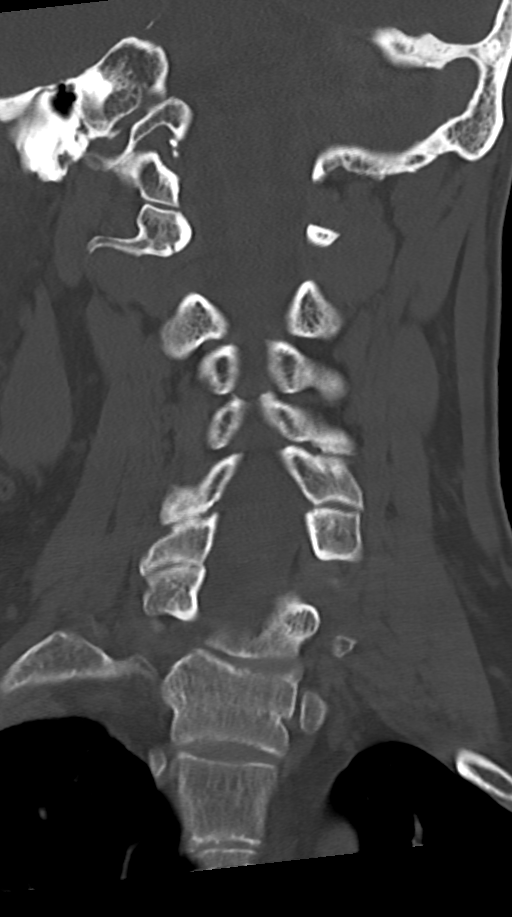
[im 42/70  bone]
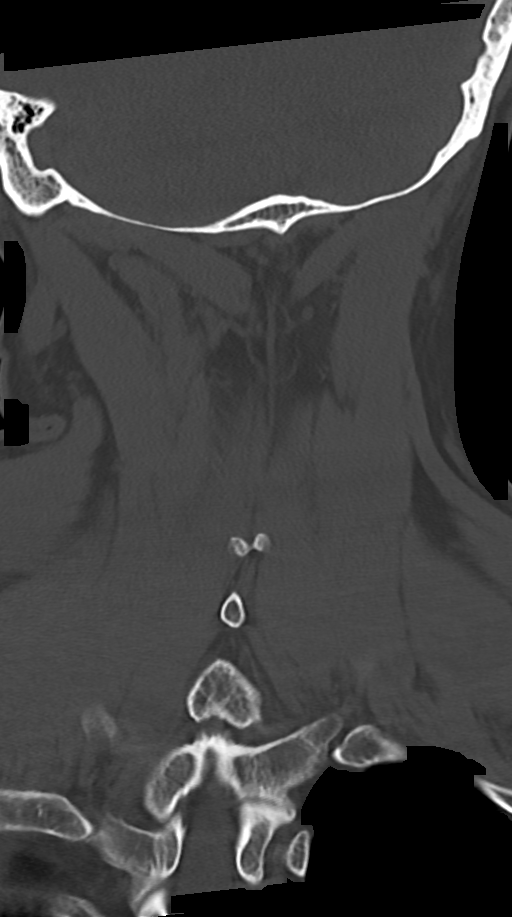

[Series 9: orthogonal axials · oblique · 0.21mm/px · 5 of 103 slices shown, 7 images]
[im 18/103  soft-tissue]
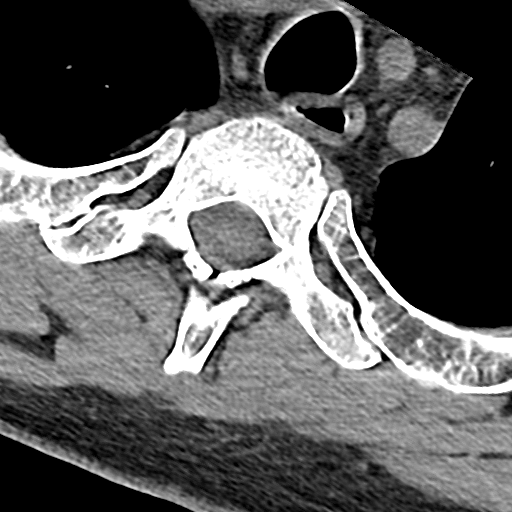
[im 18/103  bone]
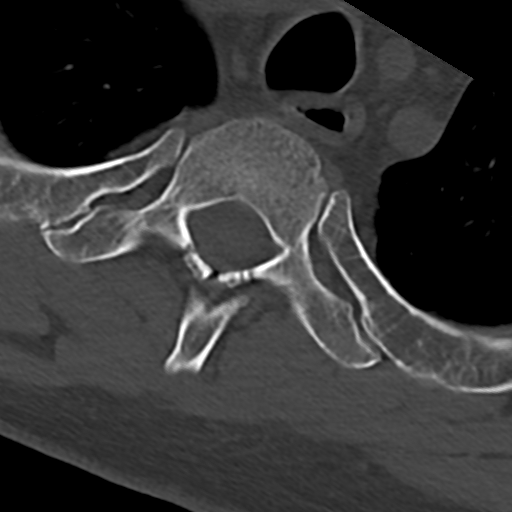
[im 35/103  bone]
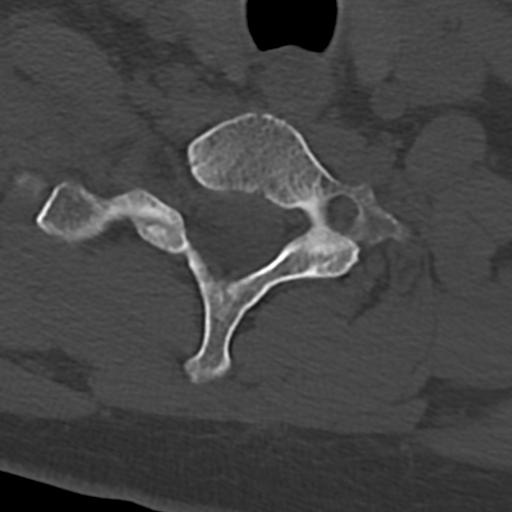
[im 52/103  bone]
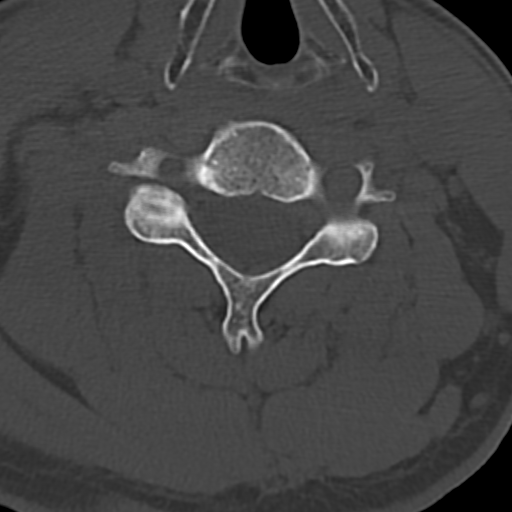
[im 69/103  bone]
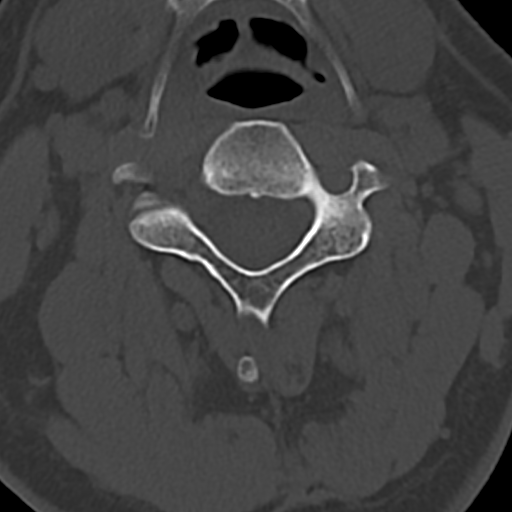
[im 86/103  soft-tissue]
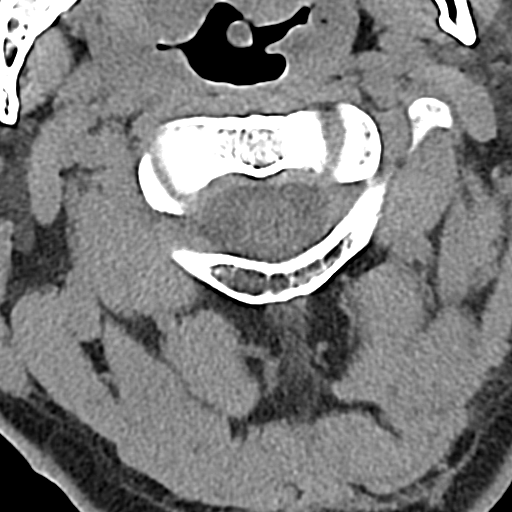
[im 86/103  bone]
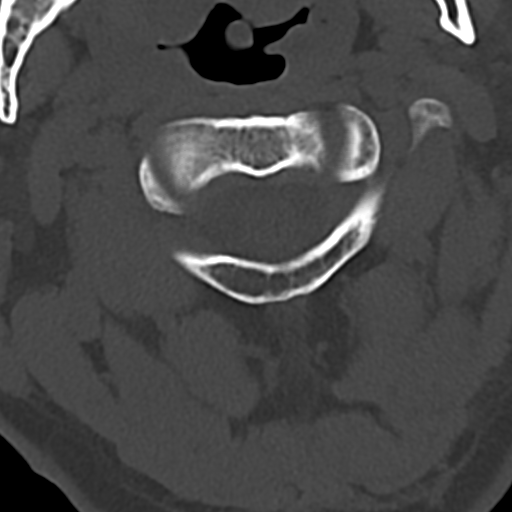

[13 of 33 positions shown; findings below may reference images not displayed]

FINDINGS: Alignment: Normal. There is no jumped or perched facets or other
evidence of traumatic malalignment. There is no antero or
retrolisthesis.

Skull base and vertebrae: Skull base alignment is maintained.
Vertebral body heights are preserved. There is no evidence of acute
fracture.

Soft tissues and spinal canal: No prevertebral fluid or swelling. No
visible canal hematoma.

Disc levels: There is minimal degenerative change at C5-C6 and
C6-C7. There is no significant spinal canal or neural foraminal
stenosis.

Upper chest: The imaged lung apices are clear.

Other: None.
IMPRESSION: No acute fracture or traumatic malalignment of the cervical spine.

## 2021-04-29 IMAGING — DX DG CHEST 1V PORT
1 series · 1 of 1 positions shown · non-contrast
Comparison: [DATE]

CLINICAL DATA: Seizures left shoulder pain

EXAM:
PORTABLE CHEST 1 VIEW

[chest ap]
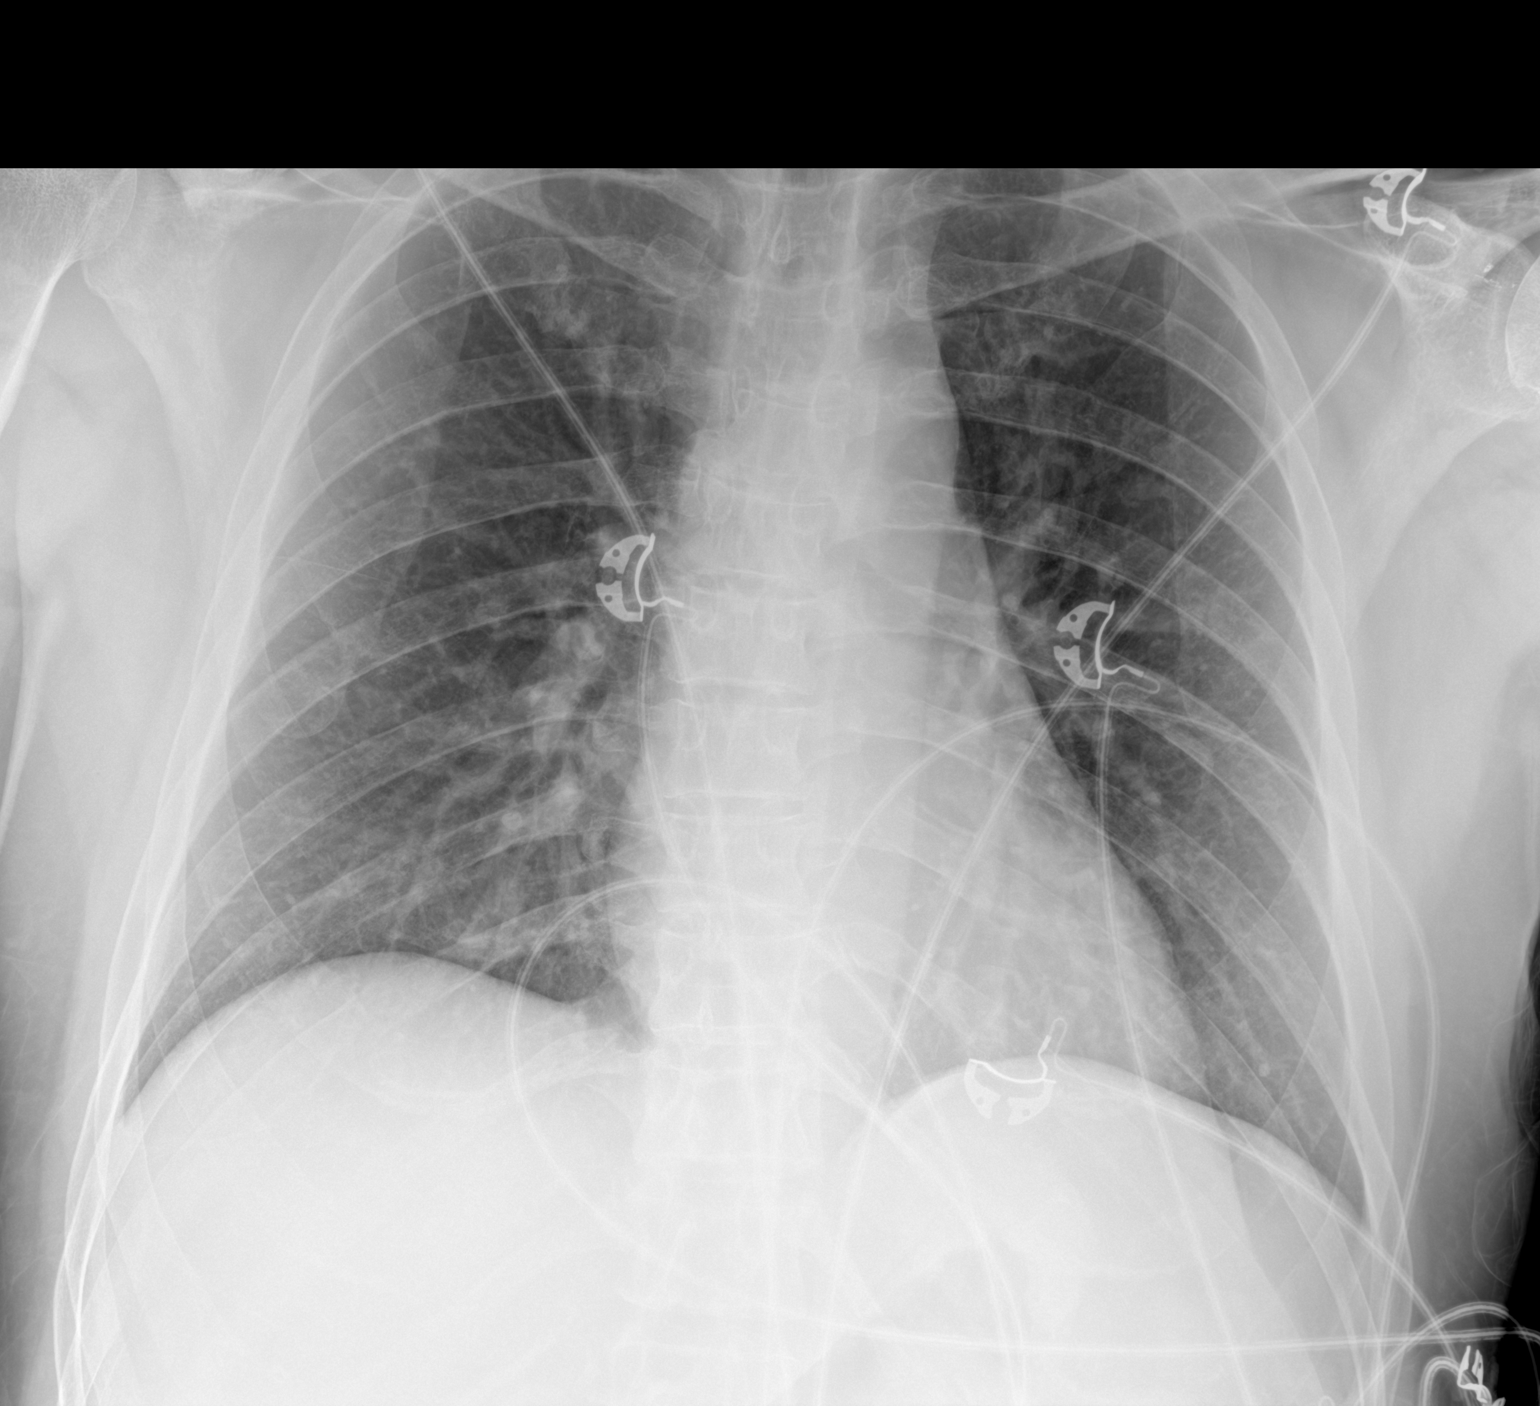

[1 of 1 positions shown; findings below may reference images not displayed]

FINDINGS: The heart size and mediastinal contours are within normal limits.
Both lungs are clear. The visualized skeletal structures are
unremarkable.
IMPRESSION: No active disease.
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
## 2021-04-29 MED ORDER — ENOXAPARIN SODIUM 40 MG/0.4ML IJ SOSY
40.0000 mg | PREFILLED_SYRINGE | INTRAMUSCULAR | Status: DC
  Administered 2021-04-29: 23:00:00 40 mg via SUBCUTANEOUS
  Filled 2021-04-29: qty 0.4

## 2021-04-29 MED ORDER — MIDAZOLAM HCL 2 MG/2ML IJ SOLN
2.0000 mg | Freq: Four times a day (QID) | INTRAMUSCULAR | Status: DC | PRN
  Administered 2021-04-29: 11:00:00 2 mg via INTRAVENOUS
  Filled 2021-04-29: qty 2

## 2021-04-29 MED ORDER — LORAZEPAM 2 MG/ML IJ SOLN
2.0000 mg | INTRAMUSCULAR | Status: DC | PRN
  Administered 2021-04-29 (×2): 2 mg via INTRAVENOUS
  Filled 2021-04-29 (×2): qty 1

## 2021-04-29 MED ORDER — LACTATED RINGERS IV BOLUS
1000.0000 mL | Freq: Once | INTRAVENOUS | Status: AC
  Administered 2021-04-29: 11:00:00 1000 mL via INTRAVENOUS

## 2021-04-29 MED ORDER — SODIUM CHLORIDE 0.9 % IV BOLUS
1000.0000 mL | Freq: Once | INTRAVENOUS | Status: AC
Start: 2021-04-29 — End: 2021-04-29
  Administered 2021-04-29: 19:00:00 1000 mL via INTRAVENOUS

## 2021-04-29 MED ORDER — ACETAMINOPHEN 500 MG PO TABS
1000.0000 mg | ORAL_TABLET | Freq: Once | ORAL | Status: AC
  Administered 2021-04-29: 10:00:00 1000 mg via ORAL
  Filled 2021-04-29: qty 2

## 2021-04-29 MED ORDER — MIDAZOLAM HCL 2 MG/2ML IJ SOLN: 2.0000 mg | Freq: Once | INTRAMUSCULAR | Status: DC

## 2021-04-29 MED ORDER — LORAZEPAM 2 MG/ML IJ SOLN
2.0000 mg | Freq: Once | INTRAMUSCULAR | Status: DC
  Filled 2021-04-29: qty 1

## 2021-04-29 MED ORDER — LORAZEPAM 2 MG/ML IJ SOLN
2.0000 mg | INTRAMUSCULAR | Status: DC | PRN
Start: 2021-04-29 — End: 2021-04-29

## 2021-04-29 MED ORDER — LEVETIRACETAM IN NACL 1000 MG/100ML IV SOLN
1000.0000 mg | Freq: Two times a day (BID) | INTRAVENOUS | Status: DC
  Filled 2021-04-29: qty 100

## 2021-04-29 MED ORDER — CHLORHEXIDINE GLUCONATE CLOTH 2 % EX PADS
6.0000 | MEDICATED_PAD | Freq: Every day | CUTANEOUS | Status: DC
Start: 2021-04-30 — End: 2021-04-30

## 2021-04-29 MED ORDER — LORAZEPAM 2 MG/ML IJ SOLN
2.0000 mg | Freq: Once | INTRAMUSCULAR | Status: AC
  Administered 2021-04-29: 20:00:00 2 mg via INTRAVENOUS
  Filled 2021-04-29: qty 1

## 2021-04-29 MED ORDER — ACETAMINOPHEN 325 MG PO TABS: 650.0000 mg | ORAL_TABLET | Freq: Four times a day (QID) | ORAL | Status: DC | PRN

## 2021-04-29 MED ORDER — LEVETIRACETAM IN NACL 1000 MG/100ML IV SOLN
1000.0000 mg | Freq: Once | INTRAVENOUS | Status: AC
  Administered 2021-04-29: 09:00:00 1000 mg via INTRAVENOUS
  Filled 2021-04-29: qty 100

## 2021-04-29 MED ORDER — SODIUM CHLORIDE 0.9 % IV SOLN
1000.0000 mg | Freq: Once | INTRAVENOUS | Status: AC
  Administered 2021-04-29: 21:00:00 1000 mg via INTRAVENOUS
  Filled 2021-04-29: qty 20

## 2021-04-29 MED ORDER — LEVETIRACETAM IN NACL 1000 MG/100ML IV SOLN
1000.0000 mg | Freq: Once | INTRAVENOUS | Status: AC
  Administered 2021-04-29: 11:00:00 1000 mg via INTRAVENOUS
  Filled 2021-04-29: qty 100

## 2021-04-29 MED ORDER — MIDAZOLAM HCL 2 MG/2ML IJ SOLN: 2.0000 mg | Freq: Once | INTRAMUSCULAR | Status: AC

## 2021-04-29 MED ORDER — LORAZEPAM 2 MG/ML IJ SOLN
2.0000 mg | Freq: Once | INTRAMUSCULAR | Status: AC
  Administered 2021-04-29: 23:00:00 2 mg via INTRAVENOUS

## 2021-04-29 MED ORDER — ACETAMINOPHEN 500 MG PO TABS: 1000.0000 mg | ORAL_TABLET | Freq: Once | ORAL | Status: DC

## 2021-04-29 MED ORDER — LEVETIRACETAM IN NACL 1000 MG/100ML IV SOLN
1000.0000 mg | Freq: Once | INTRAVENOUS | Status: AC
  Administered 2021-04-29: 20:00:00 1000 mg via INTRAVENOUS
  Filled 2021-04-29: qty 100

## 2021-04-29 MED ORDER — MIDAZOLAM HCL 2 MG/2ML IJ SOLN
INTRAMUSCULAR | Status: AC
  Filled 2021-04-29: qty 2

## 2021-04-29 MED ORDER — ONDANSETRON HCL 4 MG/2ML IJ SOLN: 4.0000 mg | Freq: Three times a day (TID) | INTRAMUSCULAR | Status: DC | PRN

## 2021-04-29 MED ORDER — MIDAZOLAM HCL 2 MG/2ML IJ SOLN
INTRAMUSCULAR | Status: AC
  Administered 2021-04-29: 09:00:00 2 mg via INTRAVENOUS
  Filled 2021-04-29: qty 2

## 2021-04-29 MED ORDER — LEVETIRACETAM 500 MG PO TABS: 1000.0000 mg | ORAL_TABLET | Freq: Two times a day (BID) | ORAL | Status: DC

## 2021-04-29 MED ORDER — PHENYTOIN SODIUM 50 MG/ML IJ SOLN
100.0000 mg | Freq: Three times a day (TID) | INTRAMUSCULAR | Status: DC
  Administered 2021-04-30: 100 mg via INTRAVENOUS
  Filled 2021-04-29 (×3): qty 2

## 2021-04-29 NOTE — ED Notes (Signed)
Nicholas Reese, EDT came out of room stating pt was seizing- this Rn to bedside to assess pt- pt noted to be having seizure-liek activity with eyes rolled back in bed and jerk like movements, no grand-mal activity noted- informed Dr Creig Hines who gave VO- see MAR for intervention ?

## 2021-04-29 NOTE — Care Management (Signed)
?  Transition of Care (TOC) Screening Note ? ? ?Patient Details  ?Name: Nicholas Reese ?Date of Birth: June 18, 1980 ? ? ?Transition of Care (TOC) CM/SW Contact:    ?Caryn Section, RN ?Phone Number: ?04/29/2021, 4:31 PM ? ? ?Patient reports he is up to date with appointments and he has signed up for insurance that will start shortly.  He declines any further TOC needs ? ? ?Transition of Care Department [facility]) has reviewed patient and no TOC needs have been identified at this time. We will continue to monitor patient advancement through interdisciplinary progression rounds. If new patient transition needs arise, please place a TOC consult. ?  ?

## 2021-04-29 NOTE — Progress Notes (Signed)
Eeg done 

## 2021-04-29 NOTE — Progress Notes (Signed)
Rapid Response Event Note  ? ?Reason for Call : seizure activity ? ? ?Initial Focused Assessment: On my arrival patient is lying flat with eyes closed and is very tense. After a couple of minutes he opened his eyes and said he wants to go home. Primary RN states that patient started having seizure like activity and she administered 2mg  of ativan. He came out of the seizure briefly and then started having what looked like seizure activity again. Pt seems to be in and out of seizure activity at this time and cannot respond enough to answer orientation questions. VSS on 2L [place]. ? ? ?Interventions: Pt turned on side, 2L [place] placed on pt. Dr. paged and arrived at bedside. Orders placed for versed push and stepdown bed. ? ? ?Plan of Care: Pt transferred to ICU 5 at this time. ? ? ? ?Event Summary:  ? ?MD Notified: Dr. Clyde Lundborg ?Call Time: 1739 ?Arrival Time: 1741 ?End Time: Pt transferred to ICU ? ?Clyde Lundborg, RN ?

## 2021-04-29 NOTE — ED Notes (Signed)
Pt assisted to use toilet in room- pt walked with steady gait ?

## 2021-04-29 NOTE — Procedures (Signed)
Patient Name: Nicholas Reese  ?MRN: [id number]  ?Epilepsy Attending: Charlsie Quest  ?Referring Physician/Provider: Caryl Pina, MD ?Date: 04/29/2021 ?Duration: 21.26 mins ? ?Patient history: 41 y.o. male with medical history significant of seizure, depression, anxiety, anticholinergic syndrome, who presents with seizure. EEG to evaluate for seizure ? ?Level of alertness: Awake, asleep ? ?AEDs during EEG study: LEV, PHT, Ativan, versed ? ?Technical aspects: This EEG study was done with scalp electrodes positioned according to the 10-20 International system of electrode placement. Electrical activity was acquired at a sampling rate of 500Hz  and reviewed with a high frequency filter of 70Hz  and a low frequency filter of 1Hz . EEG data were recorded continuously and digitally stored.  ? ?Description: The posterior dominant rhythm consists of 8-9 Hz activity of moderate voltage (25-35 uV) seen predominantly in posterior head regions, symmetric and reactive to eye opening and eye closing. Sleep was characterized by vertex waves, sleep spindles (12 to 14 Hz), maximal frontocentral region. Hyperventilation and photic stimulation were not performed.    ? ?IMPRESSION: ?This study is within normal limits. No seizures or epileptiform discharges were seen throughout the recording. ? ?  ? ?
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
## 2021-04-29 NOTE — ED Notes (Addendum)
Dr Katrinka Blazing placed order to ambulate pt in hallway- pt states he felt able to do so- this RN walked with pt towards nurses station on A side when pt stopped abruptly- this Rn asked pt if he was okay and he states he did not feel well- pt began to go slack and start having seizure like activity same as before- this Rn safely guided pt to floor (no fall and pt did not hit his head) and called for help- Dr Katrinka Blazing was called by secretary to come assess ptWhitney Post, EDT; Tawanna Solo, and Kake, RN came to help- Dr Katrinka Blazing gave VO for 2mg  of versed IV- pt was placed in stretcher in hallway and taken back to room 4 ?

## 2021-04-29 NOTE — Progress Notes (Signed)
Admission profile updated. ?
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
## 2021-04-29 NOTE — ED Triage Notes (Signed)
Pt comes into the ED via POV c/o seizure this morning.  Pt states he lives by himself but he knows how it feels after he has had one.  Pt found himself on the floor with left shoulder pain after the potential seizure.  Pt is taking his seizure medication but doesn't take them every day.  Pt is c/o left shoulder pain. Pt is neurologically intact at this time.  ?

## 2021-04-29 NOTE — Progress Notes (Signed)
Pt active seizre activity at 2140, for which pt received PRN ativan and NP on call was notified. Around 2155 Pt becoming agitated, removed IV's however would not allow staff to stop the bleeding from PIV site. He stated that "I am going home. You are not gonna keep me here." Security was called and pt was not redirectable. Pt dressed himself and left the ICU/SD department. Staff followed pt and continued to discuss the pt's need to remain at the hospital.  NP paged and found pt/staff in the hallway with security also present. Charge RN and [facility] present as well. This RN returned to department per charge RN instruction and within 10 minutes pt returned with [facility]. Pt agreeable to allow this RN to clean him and place him in a hospital gown and put him back on the monitor. Talked with pt about trust and how he could trust staff to care for him. Pt verbalizes being afraid. Will continue to monitor pt who is not IVC'd. One to one sitter will be implemented at this time.  ?

## 2021-04-29 NOTE — Progress Notes (Signed)
Cross Cover ?Patient reported numbness in left arm. 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 He reports his symptoms now are exactly what he has had in the past.  No other neuro deficits  Per history review in Epic found similar episode reported.  Could not find the neurology visit with the diagnosis of Todd's paralysis but that it was a possibility. ?CT today negative for acute findings.  Will defer to neurology for recommendations regarding ? F/u MRI ?Continue to monitor. ?
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
## 2021-04-29 NOTE — ED Notes (Signed)
This Rn and Logan, EDT accompanied pt to CT ?

## 2021-04-29 NOTE — Progress Notes (Signed)
Pt states that he is having "numbness to my entire left side". When asked when it started and if he has had this before, pt states that in started "an hour ago and I had it before after a seizure in the past. 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 Will continue to monitor at this time.  ?
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
## 2021-04-29 NOTE — Progress Notes (Signed)
? ?  Nicholas Reese WUJ:[id number] DOB: 1980-08-15 DOA: 04/29/2021 ? ? ?Subjective: Patient had witnessed seizure approx 2125 per nurse reports.  Type tonic clonic, whole body that did abate with 2 mg IV ativan.  Approx 2150, patient awoke and become agitated, disoriented and insisting he was leaving the hospital.  He was not redirectable, ripped out his IV, got himself dressed and physically walked out of the hospital despite me trying to discuss and convince him  to stay.  Administrative coordinator was able to talk him into coming back into the hospital ? ?Objective: ?Vitals:  ? 04/29/21 1816 04/29/21 2000  ?BP: 115/63 116/81  ?Pulse: 83 72  ?Resp: 18 15  ?Temp: 98.1 ?F (36.7 ?C) 97.8 ?F (36.6 ?C)  ?SpO2: 97% 100%  ? ? ? ?Assessment:  ?Neuro: Fidgety but sleepy. Reports numbness in let arm resolved. Still slightly hyperactive but redirectable. Sitter at bedside ?CV: SR, BP stable ?Resp:  Non labored, sats stable on room air ? ? ?Plan:  ?Secondary to medicated state and psychosis. IVC papers completed  ?Continue frequent neuro checks ?Sitter at bedside ? ?Family ?Patient contact Lajuana Matte [phone] - Informed of events and necessity of IVC ? ? ?Maci Eickholt L. Jon Billings NP ?[facility] ?  ?

## 2021-04-30 ENCOUNTER — Other Ambulatory Visit: Payer: Self-pay

## 2021-04-30 DIAGNOSIS — Y92009 Unspecified place in unspecified non-institutional (private) residence as the place of occurrence of the external cause: Secondary | ICD-10-CM

## 2021-04-30 LAB — GLUCOSE, CAPILLARY
Glucose-Capillary: 91 mg/dL (ref 70–99)
Glucose-Capillary: 86 mg/dL (ref 70–99)
Glucose-Capillary: 99 mg/dL (ref 70–99)

## 2021-04-30 LAB — HIV ANTIBODY (ROUTINE TESTING W REFLEX): HIV Screen 4th Generation wRfx: NONREACTIVE

## 2021-04-30 LAB — LEVETIRACETAM LEVEL: Levetiracetam Lvl: 39.9 ug/mL (ref 10.0–40.0)

## 2021-04-30 MED ORDER — LEVETIRACETAM 1000 MG PO TABS: 1000.0000 mg | ORAL_TABLET | Freq: Two times a day (BID) | ORAL | 1 refills | Status: DC

## 2021-04-30 MED ORDER — PAROXETINE HCL 20 MG PO TABS
20.0000 mg | ORAL_TABLET | Freq: Every day | ORAL | Status: DC
  Administered 2021-04-30: 20 mg via ORAL
  Filled 2021-04-30: qty 1

## 2021-04-30 MED ORDER — LEVETIRACETAM 500 MG PO TABS
1000.0000 mg | ORAL_TABLET | Freq: Two times a day (BID) | ORAL | Status: DC
  Administered 2021-04-30: 1000 mg via ORAL
  Filled 2021-04-30 (×2): qty 2

## 2021-04-30 MED ORDER — LEVETIRACETAM 1000 MG PO TABS
1000.0000 mg | ORAL_TABLET | Freq: Two times a day (BID) | ORAL | 2 refills | Status: DC
  Filled 2021-04-30: qty 60, 30d supply, fill #0

## 2021-04-30 NOTE — TOC Initial Note (Signed)
Transition of Care (TOC) - Initial/Assessment Note  ? ? ?Patient Details  ?Name: Nicholas Reese ?MRN: [id number] ?Date of Birth: 1980/04/15 ? ?Transition of Care (TOC) CM/SW Contact:    ?Allayne Butcher, RN ?Phone Number: ?04/30/2021, 12:21 PM ? ?Clinical Narrative:                 ?Patient is medically cleared for discharge.  IVC has been rescinded.  Patient is glad to be going home.  He reports he has not been able to consistently get his prescriptions, he has no insurance and does not currently have a PCP.  Prescription for today sent [facility] to [facility]- he know to go other there and pick it up.  He agrees to an appointment being scheduled at [facility] in [facility]- he has people that can drive him.  Appointment scheduled for Monday April 10th at 0920.  He knows to arrive 15 minutes early and provide a photo ID, proof of address, and proof of income. ? 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     ? ?Expected Discharge Plan: Home/Self Care ?Barriers to Discharge: Barriers Resolved ? ? ?Patient Goals and CMS Choice ?  ?  ?  ? ?Expected Discharge Plan and Services ?Expected Discharge Plan: Home/Self Care ?  ?Discharge Planning Services: CM Consult, Medication Assistance, Follow-up appt scheduled, [facility] ?  ?Living arrangements for the past 2 months: Apartment ?Expected Discharge Date: 04/30/21               ?DME Arranged: N/A ?DME Agency: NA ?  ?  ?  ?HH Arranged: NA ?HH Agency: NA ?  ?  ?  ? ?Prior Living Arrangements/Services ?Living arrangements for the past 2 months: Apartment ?Lives with:: Other (Comment), Minor Children (children's mother) ?Patient language and need for interpreter reviewed:: Yes ?Do you feel safe going back to the place where you live?: Yes      ?Need for Family Participation in Patient Care: Yes (Comment) ?Care giver support system in place?: Yes (comment) ?  ?Criminal Activity/Legal Involvement Pertinent to  Current Situation/Hospitalization: No - Comment as needed ? ?Activities of Daily Living ?Home Assistive Devices/Equipment: None ?ADL Screening (condition at time of admission) ?Patient's cognitive ability adequate to safely complete daily activities?: Yes ?Is the patient deaf or have difficulty hearing?: No ?Does the patient have difficulty seeing, even when wearing glasses/contacts?: No ?Does the patient have difficulty concentrating, remembering, or making decisions?: No ?Patient able to express need for assistance with ADLs?: Yes ?Does the patient have difficulty dressing or bathing?: No ?Independently performs ADLs?: Yes (appropriate for developmental age) ?Does the patient have difficulty walking or climbing stairs?: No ?Weakness of Legs: None ?Weakness of Arms/Hands: None ? ?Permission Sought/Granted ?Permission sought to share information with : Case Manager, Other (comment) ?Permission granted to share information with : Yes, Verbal Permission Granted ?   ? Permission granted to share info w AGENCY: [facility] services- Sheliah Mends, Phineas Real ?   ?   ? ?Emotional Assessment ?Appearance:: Appears stated age ?Attitude/Demeanor/Rapport: Engaged ?Affect (typically observed): Accepting ?Orientation: : Oriented to Self, Oriented to Place, Oriented to  Time, Oriented to Situation ?Alcohol / Substance Use: Tobacco Use ?Psych Involvement: No (comment) ? ?Admission diagnosis:  Seizure (HCC) [R56.9] ?Fall, initial encounter [W19.XXXA] ?Acute pain of left shoulder [M25.512] ?Atrial fibrillation, unspecified type (HCC) [I48.91] ?Patient Active Problem List  ? Diagnosis Date Noted  ? Seizure (HCC) 04/29/2021  ?  Depression with anxiety 04/29/2021  ? Fall at home, initial encounter 04/29/2021  ? Acute focal neurologic deficit with partial resolution 08/04/2019  ? Recurrent seizures (HCC) 08/04/2019  ? Acute focal neurological deficit 08/04/2019  ? 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 Seizure disorder  (HCC) 04/29/2019  ? 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 Pneumonia 04/07/2017  ? Flu 04/06/2017  ? Generalized anxiety disorder 06/23/2016  ? 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 ?PCP:  Pcp, No ?Pharmacy:   ?[facility] ?[address] ?[place] [address] ?Phone: [phone] Fax: [phone] ? ?[facility] [address] [place] - [address] ?[address] ?[place] [address] ?Phone: [phone] Fax: [phone] ? ? ? ? ?Social Determinants of Health (SDOH) Interventions ?  ? ?Readmission Risk Interventions ?No flowsheet data found. ? ? ?

## 2021-04-30 NOTE — Assessment & Plan Note (Signed)
Resume home medicines. 

## 2021-04-30 NOTE — Plan of Care (Signed)
Continuing with plan of care. 

## 2021-04-30 NOTE — Assessment & Plan Note (Signed)
Due to seizure. ?

## 2021-04-30 NOTE — Progress Notes (Signed)
Updated patient, around noontime, that the doctor was discharging him that the doctor would be discharging him and I would bring him the paper work once it was ready.  Patient stated that he wanted to eat lunch first.  Paperwork prepared and printed, upon entering patient's room patient's shirt and pants were left on the bed and all other belongings were gone, patient was not in the room as well. Patient belongings were placed in patient discharge bag along with discharge paperwork as patient has left before discharge instructions could be given to him. ?

## 2021-04-30 NOTE — Hospital Course (Signed)
Nicholas Reese is a 41 y.o. male with medical history significant of seizure, depression, anxiety, anticholinergic syndrome, who presents with seizure. ?  ?Patient states that he is supposed to take Keppra 1000 mg twice daily, but he has been taking his medications intermittently. ? ?He had a postictal confusion and observed in the intensive care unit.  He was given IV Keppra 1000 mg twice a day.  Condition had improved today, discussed with Dr. Otelia Limes, will continue Keppra 1000 mg twice a day orally.  ? ?Patient seizure was due to noncompliance with seizure treatment.  Advised him to take medicine as prescribed. ?Patient is medically stable to be discharged at this point. ?
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
## 2021-04-30 NOTE — Assessment & Plan Note (Signed)
Patient had episode of seizure in the hospital, he was postictal.  EEG did not show additional seizure activity. ?Condition was caused by noncompliant with seizure medicine.  At this point, he is medically stable to be discharged. ?

## 2021-04-30 NOTE — Progress Notes (Addendum)
At beginning of the shift pulled out IV and was attempting to leave.  Patient was reminded by previous nurse that he was IVC'ed and the psychiatrist would have to see the patient in order for this to be lifted.  Patient continued to be adamant about leaving and security was called, Dr. Chipper Herb was also paged to see the patient.  Patient was informed that the psychiatrist would be reached out to and patient was calm once updated on this.  Patient took himself off of cardiac monitoring as well and refuses to go back on the monitor and further refused assessment by RN.  Sitter is currently in room, patient is resting comfortably in bed, and continues to wait for psychiatrist.  Will continue to monitor patient. ?

## 2021-04-30 NOTE — Discharge Instructions (Signed)
Appointment has been scheduled for primary care at [facility] in [place].  Clinic is located in the [employer].  They will call you the week before to confirm your appointment.  Please arrive 15 before scheduled time. ?Make sure you take your photo ID, proof of address like a bill with your address on it, and proof of income like a pay stub.   ?  ?

## 2021-05-03 ENCOUNTER — Other Ambulatory Visit: Payer: Self-pay

## 2021-05-03 ENCOUNTER — Observation Stay
Admission: EM | Admit: 2021-05-03 | Discharge: 2021-05-04 | Discharge status: Against Medical Advice | Payer: Self-pay | Attending: Internal Medicine | Admitting: Internal Medicine

## 2021-05-03 DIAGNOSIS — Z20822 Contact with and (suspected) exposure to covid-19: Secondary | ICD-10-CM | POA: Insufficient documentation

## 2021-05-03 DIAGNOSIS — G40909 Epilepsy, unspecified, not intractable, without status epilepticus: Principal | ICD-10-CM | POA: Insufficient documentation

## 2021-05-03 DIAGNOSIS — Z79899 Other long term (current) drug therapy: Secondary | ICD-10-CM | POA: Insufficient documentation

## 2021-05-03 DIAGNOSIS — R569 Unspecified convulsions: Secondary | ICD-10-CM

## 2021-05-03 DIAGNOSIS — Z87891 Personal history of nicotine dependence: Secondary | ICD-10-CM | POA: Insufficient documentation

## 2021-05-03 LAB — COMPREHENSIVE METABOLIC PANEL
ALT: 14 U/L (ref 0–44)
AST: 17 U/L (ref 15–41)
Albumin: 4.4 g/dL (ref 3.5–5.0)
Alkaline Phosphatase: 54 U/L (ref 38–126)
Anion gap: 8 (ref 5–15)
BUN: 15 mg/dL (ref 6–20)
CO2: 22 mmol/L (ref 22–32)
Calcium: 9.4 mg/dL (ref 8.9–10.3)
Chloride: 106 mmol/L (ref 98–111)
Creatinine, Ser: 1.07 mg/dL (ref 0.61–1.24)
GFR, Estimated: >60 mL/min (ref >60)
Glucose, Bld: 106 mg/dL — ABNORMAL HIGH (ref 70–99)
Potassium: 3.4 mmol/L — ABNORMAL LOW (ref 3.5–5.1)
Sodium: 136 mmol/L (ref 135–145)
Total Bilirubin: 0.4 mg/dL (ref 0.3–1.2)
Total Protein: 7.3 g/dL (ref 6.5–8.1)

## 2021-05-03 LAB — CBC
HCT: 41.5 % (ref 39.0–52.0)
Hemoglobin: 13.9 g/dL (ref 13.0–17.0)
MCH: 32.7 pg (ref 26.0–34.0)
MCHC: 33.5 g/dL (ref 30.0–36.0)
MCV: 97.6 fL (ref 80.0–100.0)
Platelets: 289 10*3/uL (ref 150–400)
RBC: 4.25 MIL/uL (ref 4.22–5.81)
RDW: 12.3 % (ref 11.5–15.5)
WBC: 8.7 10*3/uL (ref 4.0–10.5)
nRBC: 0 % (ref 0.0–0.2)

## 2021-05-03 LAB — ETHANOL: Alcohol, Ethyl (B): <10 mg/dL (ref <10)

## 2021-05-03 LAB — GLUCOSE, CAPILLARY: Glucose-Capillary: 143 mg/dL — ABNORMAL HIGH (ref 70–99)

## 2021-05-03 LAB — RESP PANEL BY RT-PCR (FLU A&B, COVID) ARPGX2
Influenza A by PCR: NEGATIVE
Influenza B by PCR: NEGATIVE
SARS Coronavirus 2 by RT PCR: NEGATIVE

## 2021-05-03 MED ORDER — LEVETIRACETAM IN NACL 1500 MG/100ML IV SOLN
1500.0000 mg | INTRAVENOUS | Status: DC
  Filled 2021-05-03: qty 100

## 2021-05-03 MED ORDER — SODIUM CHLORIDE 0.9 % IV SOLN: INTRAVENOUS | Status: AC

## 2021-05-03 MED ORDER — LORAZEPAM 2 MG/ML IJ SOLN
2.0000 mg | INTRAMUSCULAR | Status: DC | PRN
  Administered 2021-05-03 – 2021-05-04 (×4): 2 mg via INTRAVENOUS
  Filled 2021-05-03 (×4): qty 1

## 2021-05-03 MED ORDER — PHENYTOIN SODIUM 50 MG/ML IJ SOLN
100.0000 mg | INTRAMUSCULAR | Status: AC
  Administered 2021-05-03: 100 mg via INTRAVENOUS
  Filled 2021-05-03: qty 2

## 2021-05-03 MED ORDER — ENOXAPARIN SODIUM 40 MG/0.4ML IJ SOSY: 40.0000 mg | PREFILLED_SYRINGE | INTRAMUSCULAR | Status: DC

## 2021-05-03 MED ORDER — LEVETIRACETAM IN NACL 1000 MG/100ML IV SOLN
INTRAVENOUS | Status: AC
  Administered 2021-05-03: 1000 mg
  Filled 2021-05-03: qty 100

## 2021-05-03 MED ORDER — ACETAMINOPHEN 325 MG PO TABS: 650.0000 mg | ORAL_TABLET | Freq: Four times a day (QID) | ORAL | Status: DC | PRN

## 2021-05-03 MED ORDER — ACETAMINOPHEN 650 MG RE SUPP: 650.0000 mg | Freq: Four times a day (QID) | RECTAL | Status: DC | PRN

## 2021-05-03 MED ORDER — LEVETIRACETAM 500 MG PO TABS
1000.0000 mg | ORAL_TABLET | Freq: Two times a day (BID) | ORAL | Status: DC
  Administered 2021-05-04: 1000 mg via ORAL
  Filled 2021-05-03: qty 2

## 2021-05-03 MED ORDER — LEVETIRACETAM IN NACL 500 MG/100ML IV SOLN
500.0000 mg | INTRAVENOUS | Status: AC
Start: 2021-05-03 — End: 2021-05-03
  Administered 2021-05-03: 500 mg via INTRAVENOUS
  Filled 2021-05-03: qty 100

## 2021-05-03 MED ORDER — SODIUM CHLORIDE 0.9 % IV SOLN
15.0000 mg/kg | Freq: Once | INTRAVENOUS | Status: AC
  Administered 2021-05-04: 825 mg via INTRAVENOUS
  Filled 2021-05-03: qty 16.5

## 2021-05-03 MED ORDER — POTASSIUM CHLORIDE CRYS ER 20 MEQ PO TBCR
30.0000 meq | EXTENDED_RELEASE_TABLET | Freq: Once | ORAL | Status: AC
  Administered 2021-05-03: 30 meq via ORAL
  Filled 2021-05-03: qty 1

## 2021-05-03 MED ORDER — SODIUM CHLORIDE 0.9% FLUSH
3.0000 mL | Freq: Two times a day (BID) | INTRAVENOUS | Status: DC
  Administered 2021-05-03 – 2021-05-04 (×2): 3 mL via INTRAVENOUS

## 2021-05-03 MED ORDER — LORAZEPAM 2 MG/ML IJ SOLN
4.0000 mg | Freq: Once | INTRAMUSCULAR | Status: AC
  Administered 2021-05-03: 4 mg via INTRAVENOUS
  Filled 2021-05-03: qty 2
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
## 2021-05-03 NOTE — Progress Notes (Signed)
Pt actively having seizure at bedside. Given IV ativan PRN per order. ?

## 2021-05-03 NOTE — ED Notes (Signed)
Seizure pads placed on bed

## 2021-05-03 NOTE — ED Triage Notes (Signed)
Pt arrived via [facility]. Pt was given 7 versed total for seizures while in route. SO states pt had 10 before EMS arrival. Pt was seen here recently for same and DC with a referral to [facility] neurology. Pt states he has not been taking his Keppra.  ?

## 2021-05-03 NOTE — Consult Note (Signed)
[facility] Tele[facility]urology Consult Services ? ?Stat Consult ? ?Patient Name:   Nicholas Reese, Nicholas Reese ?Date of Birth:   1980-05-14 ?Identification Number:   MRN - [id number] ?Date of Service:   05/03/2021 22:41:59 ? ?Diagnosis: ?      R56.9 - Seizures ? ?Impression ?41 y/o man with recurrent episodes of status epilepticus in setting of nonadherence with Keppra. Has continued to have seizures in the hospital tonight despite loading Keppra and multiple doses of benzo. It looks like he did well after Dilantin loading during his most recent hospital admission, so I recommend a Dilantin load - he was given 100 mg in the ED which is not a loading dose; I ordered another 15 mg/kg to be given now as I was unable to reach the hospitalist at the number provided to [place] to discuss this recommendation.  Likely can be continued on Keppra alone if he remains seizure-free.  Patient is altered at this time so I was unable to provide counseling, will need counseling tomorrow on the dangers of continued nonadherence with his meds, please also make sure he is reported to the [facility] if he has a driver's license. ? ? ?Disposition: ?Neurology will follow ? ? ? ?---------------------------------------------------------------------------------------------------- ? ?Metrics: ?[facility] Notification Time: 05/03/2021 22:39:39 ?Stamp Time: 05/03/2021 22:41:59 ?Callback Response Time: 05/03/2021 22:42:33 ? ? ? ? ?---------------------------------------------------------------------------------------------------- ? ?Chief Complaint: ?seizures ? ?History of Present Illness: ?Patient is a 41 year old Male. ?Patient has a history of epilepsy and several recent hospitalizations in setting of nonadherence with AEDs. Per chart he is prescribed Keppra 1g BID. He has received at least three doses of benzo already between EMS and the ED, and also Keppra 1500 mg and Dilantin 100 mg IV. ?  ? ?Medications: ? ?No Anticoagulant use  ?No  Antiplatelet use ? ?Allergies:  ?Reviewed ? ?Social History: ?Unable To Obtain Due To Patient Status : Patient Is Confused ? ?Family History: ? ?Family History Cannot Be Obtained Because:Patient Is Confused ? ?ROS : ROS Cannot Be Obtained Because:  Patient Is Confused ? ?Past Surgical History: ?There Is No Surgical History Contributory To Today?s Visit ? ?  ?Examination: ?BP(107/77), Pulse(77), ? ?Neuro Exam: ?General: Alert,Awake, Oriented, Place, Person ? ?Speech: Fluent: ? ?Language: Intact: ? ?Face: Symmetric: ? ?Facial Sensation: Intact: ? ?Visual Fields: Intact: ? ?Extraocular Movements: Intact: ? ?Motor Exam: No Drift: ? ?Sensation: Intact: ? ?Coordination: Intact: ? ? ? ? ?Patient / Family was informed the Neurology Consult would occur via TeleHealth consult by way of interactive audio and video telecommunications and consented to receiving care in this manner. ? ?Patient is being evaluated for possible acute neurologic impairment and high probability of imminent or life - threatening deterioration.I spent total of 20 minutes providing care to this patient, including time for face to face visit via telemedicine, review of medical records, imaging studies and discussion of findings with providers, the patient and / or family. ? ? ?Dr Sherilyn Cooter ? ? ?[facility] ?[phone] ? ?Case [id number] ?  ?

## 2021-05-03 NOTE — Progress Notes (Incomplete)
° °      CROSS COVER NOTE  NAME: Nicholas Reese MRN: [id number] DOB : [birth year]  Seizures 8 since 2030 per RN report  4 mg IV ativan given and nursing reports the seizure terminated right before I presented to bedside  CBG 143   2315: I was at bedside and able to witness the patient's seizure activity.   Patient tensed up, LLE began trembling and then patient made large exaggerated movement turning from on his left side to his right side.   At this time I did a drop arm test x2 and patient did not allow arm to hit his face either time. 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  Bishop Limbo MHA, MSN, FNP-BC Nurse Practitioner [facility] Pager [phone]

## 2021-05-03 NOTE — ED Provider Notes (Signed)
? ?[facility] ?Provider Note ? ? ? Event Date/Time  ? First MD Initiated Contact with Patient 05/03/21 1813   ?  (approximate) ? ? ?History  ? ?Seizures ? ? ?HPI ? ?Nicholas Reese is a 41 y.o. male with a history of epilepsy/seizure disorder ? ?Patient was discharged from the hospital 3 days ago.  Patient is alert oriented able to tell me that since he left the hospital he has not taken any of his medication.  He supposed to be on Keppra 1000, and advises that he has not taken it since he left the hospital ? ?He is also supposed to follow-up with [facility] seizure specialist, but has not scheduled that yet. ? ?Today EMS reports he had several witnessed generalized tonic-clonic seizures occurring in the presence of his family or girlfriend, unclear which and then had also several with EMS.  He was administered 5 mg of midazolam intramuscularly and 2 mg IV, now alert and well-oriented. ? ?Denies pain or discomfort.  Reports he feels weak in his left lower leg. (With prior seizure also had paralysis on the left).  Denies any fall or injury.  Reports he has a bruise over the left lower side of his abdomen its been present since he was in the ICU ?  ? ? ?Physical Exam  ? ?Triage Vital Signs: ?ED Triage Vitals  ?Enc Vitals Group  ?   BP   ?   Pulse   ?   Resp   ?   Temp   ?   Temp src   ?   SpO2   ?   Weight   ?   Height   ?   Head Circumference   ?   Peak Flow   ?   Pain Score   ?   Pain Loc   ?   Pain Edu?   ?   Excl. in GC?   ? ? ?Most recent vital signs: ?Vitals:  ? 05/03/21 1830 05/03/21 1900  ?BP: 123/84 122/78  ?Pulse: 91 82  ?Resp:  13  ?Temp:    ?SpO2: 95% 100%  ? ? ? ?General: Awake, no distress.  Pleasant.  Alert and oriented at this time. ?CV:  Good peripheral perfusion.  Normal heart tones slightly tachycardic ?Resp:  Normal effort.  Clear lung sounds bilateral ?Abd:  No distention.  Ecchymosis to the left lower quadrant, denies pain or discomfort.  No surrounding hematoma or  swelling ?Other:  Moves all extremities well except weakness in the left lower extremity.  Also reports paresthesias slight numbness feeling in this extremity.  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 ? ?ED Results / Procedures / Treatments  ? ?Labs ?(all labs ordered are listed, but only abnormal results are displayed) ?Labs Reviewed  ?COMPREHENSIVE METABOLIC PANEL - Abnormal; Notable for the following components:  ?    Result Value  ? Potassium 3.4 (*)   ? Glucose, Bld 106 (*)   ? All other components within normal limits  ?RESP PANEL BY RT-PCR (FLU A&B, COVID) ARPGX2  ?CBC  ?ETHANOL  ?BASIC METABOLIC PANEL  ?MAGNESIUM  ?CBC  ? ? ? ?EKG ? ?Reviewed inter by me at 2018 ?Heart rate 80 ?QRS 90 ?QTc 440 ?Normal sinus rhythm, no evidence of ischemia or ectopy ? ? ?RADIOLOGY ? ? ? ?Patient has a well-documented established history of seizure disorder, was recently discharged from the hospital.  He denies any trauma.  There is no evidence of  injury to the head denies headache neck pain , and there is no signs of clinical injury to the patient.  At this point he does report paresthesia and weakness left lower extremity but this is also consistent with previous Todd's paralysis.  I do not believe that repeat imaging is necessitated at this time as the patient is to have ongoing seizure-like activity/recalcitrant ? ?PROCEDURES: ? ?Critical Care performed: No ? ?Procedures ? ? ?MEDICATIONS ORDERED IN ED: ?Medications  ?levETIRAcetam (KEPPRA) tablet 1,000 mg (has no administration in time range)  ?enoxaparin (LOVENOX) injection 40 mg (has no administration in time range)  ?sodium chloride flush (NS) 0.9 % injection 3 mL (has no administration in time range)  ?0.9 %  sodium chloride infusion (has no administration in time range)  ?acetaminophen (TYLENOL) tablet 650 mg (has no administration in time range)  ?  Or  ?acetaminophen (TYLENOL) suppository 650 mg (has no administration in time range)  ?potassium chloride  SA (KLOR-CON M) CR tablet 30 mEq (has no administration in time range)  ?LORazepam (ATIVAN) injection 2 mg (has no administration in time range)  ?phenytoin (DILANTIN) injection 100 mg (100 mg Intravenous Given 05/03/21 1837)  ?levETIRAcetam (KEPPRA) IVPB 500 mg/100 mL premix (0 mg Intravenous Stopped 05/03/21 1904)  ?levETIRAcetam (KEPPRA) 1000 MG/100ML IVPB (0 mg  Stopped 05/03/21 1916)  ? ? ? ?IMPRESSION / MDM / ASSESSMENT AND PLAN / ED COURSE  ?I reviewed the triage vital signs and the nursing notes. ?             ?               ? ?Differential diagnosis includes, but is not limited to, seizures, possible metabolic or toxic etiologies, intracranial hemorrhage, stroke, central neurologic condition, arrhythmia etc. ? ? ? ?The patient is on the cardiac monitor to evaluate for evidence of arrhythmia and/or significant heart rate changes. ? ? ?Labs reviewed, CBC normal metabolic panel normal with exception of very mild hypokalemia. ? ?----------------------------------------- ?8:07 PM on 05/03/2021 ?----------------------------------------- ?Patient understanding of plan for admission.  He is alert oriented conversant with hospitalist at this time.  Consult was placed with Dr. Antionette Char, who after discussing the case is agreeable with admitting the patient for further observation due to the report of multiple seizure events occurring today ? ? ?FINAL CLINICAL IMPRESSION(S) / ED DIAGNOSES  ? ?Final diagnoses:  ?Seizure disorder (HCC)  ? ? ? ?Rx / DC Orders  ? ?ED Discharge Orders   ? ? None  ? ?  ? ? ? ?Note:  This document was prepared using Dragon voice recognition software and may include unintentional dictation errors. ?  Sharyn Creamer, MD ?05/03/21 2154 ? ?
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
## 2021-05-04 ENCOUNTER — Inpatient Hospital Stay
Admission: AD | Admit: 2021-05-04 | Payer: Self-pay | Source: Other Acute Inpatient Hospital | Admitting: Internal Medicine

## 2021-05-04 LAB — BASIC METABOLIC PANEL
Anion gap: 5 (ref 5–15)
BUN: 14 mg/dL (ref 6–20)
CO2: 24 mmol/L (ref 22–32)
Calcium: 9.1 mg/dL (ref 8.9–10.3)
Chloride: 109 mmol/L (ref 98–111)
Creatinine, Ser: 1.05 mg/dL (ref 0.61–1.24)
GFR, Estimated: >60 mL/min (ref >60)
Glucose, Bld: 93 mg/dL (ref 70–99)
Potassium: 3.9 mmol/L (ref 3.5–5.1)
Sodium: 138 mmol/L (ref 135–145)

## 2021-05-04 LAB — CBC
HCT: 38.9 % — ABNORMAL LOW (ref 39.0–52.0)
Hemoglobin: 13.1 g/dL (ref 13.0–17.0)
MCH: 32.4 pg (ref 26.0–34.0)
MCHC: 33.7 g/dL (ref 30.0–36.0)
MCV: 96.3 fL (ref 80.0–100.0)
Platelets: 269 10*3/uL (ref 150–400)
RBC: 4.04 MIL/uL — ABNORMAL LOW (ref 4.22–5.81)
RDW: 12.3 % (ref 11.5–15.5)
WBC: 6 10*3/uL (ref 4.0–10.5)
nRBC: 0 % (ref 0.0–0.2)

## 2021-05-04 LAB — MAGNESIUM: Magnesium: 2.1 mg/dL (ref 1.7–2.4)

## 2021-05-04 LAB — GLUCOSE, CAPILLARY: Glucose-Capillary: 101 mg/dL — ABNORMAL HIGH (ref 70–99)

## 2021-05-04 MED ORDER — LEVETIRACETAM 750 MG PO TABS: 1500.0000 mg | ORAL_TABLET | Freq: Two times a day (BID) | ORAL | Status: DC

## 2021-05-04 MED ORDER — LEVETIRACETAM 500 MG PO TABS: 500.0000 mg | ORAL_TABLET | Freq: Once | ORAL | Status: DC

## 2021-05-04 MED ORDER — LORAZEPAM 2 MG/ML IJ SOLN
2.0000 mg | Freq: Once | INTRAMUSCULAR | Status: DC
Start: 2021-05-04 — End: 2021-05-04
  Filled 2021-05-04: qty 1

## 2021-05-04 MED ORDER — SODIUM CHLORIDE 0.9 % IV SOLN: 2000.0000 mg | Freq: Once | INTRAVENOUS | Status: DC

## 2021-05-04 MED ORDER — LEVETIRACETAM IN NACL 1000 MG/100ML IV SOLN
1000.0000 mg | Freq: Once | INTRAVENOUS | Status: AC
  Administered 2021-05-04: 1000 mg via INTRAVENOUS
  Filled 2021-05-04: qty 100

## 2021-05-04 MED ORDER — LEVETIRACETAM IN NACL 1000 MG/100ML IV SOLN
1000.0000 mg | INTRAVENOUS | Status: AC
  Administered 2021-05-04: 1000 mg via INTRAVENOUS
  Filled 2021-05-04: qty 100

## 2021-05-04 MED ORDER — SODIUM CHLORIDE 0.9 % IV BOLUS
1000.0000 mL | Freq: Once | INTRAVENOUS | Status: AC
  Administered 2021-05-04: 1000 mL via INTRAVENOUS

## 2021-05-04 MED ORDER — LORAZEPAM 2 MG/ML IJ SOLN: 2.0000 mg | INTRAMUSCULAR | 0 refills | Status: AC | PRN

## 2021-05-04 MED ORDER — PHENYTOIN 50 MG PO CHEW: 150.0000 mg | CHEWABLE_TABLET | Freq: Two times a day (BID) | ORAL | Status: AC

## 2021-05-04 MED ORDER — LEVETIRACETAM 750 MG PO TABS
1500.0000 mg | ORAL_TABLET | Freq: Two times a day (BID) | ORAL | Status: DC
  Filled 2021-05-04: qty 2

## 2021-05-04 MED ORDER — ENOXAPARIN SODIUM 40 MG/0.4ML IJ SOSY: 40.0000 mg | PREFILLED_SYRINGE | INTRAMUSCULAR | Status: AC

## 2021-05-04 MED ORDER — CHLORHEXIDINE GLUCONATE CLOTH 2 % EX PADS
6.0000 | MEDICATED_PAD | Freq: Every day | CUTANEOUS | Status: DC
  Administered 2021-05-04: 6 via TOPICAL

## 2021-05-04 MED ORDER — PHENYTOIN 50 MG PO CHEW
150.0000 mg | CHEWABLE_TABLET | Freq: Two times a day (BID) | ORAL | Status: DC
  Administered 2021-05-04: 150 mg via ORAL
  Filled 2021-05-04 (×4): qty 3

## 2021-05-04 NOTE — Progress Notes (Signed)
?  Chaplain On-Call responded to Rapid Response notification. ? ?Arriving on the Unit, this Chaplain learned from Staff that the RR team presence was of short duration, and that the patient now is resting comfortably and not available for a visit. ? ?Chaplain will refer to Afternoon Chaplain for follow up if requested. ? ?Chaplain Evelena Peat ?M.Div., [facility]  ?

## 2021-05-04 NOTE — Progress Notes (Signed)
Patient rolling over on the side of the bed.  Miranda girlfriend says patient is having a seizure.  At this time food arrived.  Asked patient is he wanted to eat did not answer.  Went to remove food and patient sat up and ate his food.   ?
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
## 2021-05-04 NOTE — Progress Notes (Signed)
Received a phone call from transfer center saying patient has a bed.  Another RN came and said patient is leaving.  Patient states since we are not giving him medication to treat his seizures he is leaving.  I explained to him that we had a bed at [facility] in [place].  Patient continued to rip off leads and get dressed.  Patient refused to sign AMA paperwork.  Messaged MD and let her know called and let transfer center know patient has left.   ?

## 2021-05-04 NOTE — Progress Notes (Signed)
Upon shift change, pt was able to communicate with night RN and dayshift RN.  Pt states to dayshift RN, "Are you a nice nurse?".  Pt did demonstrate a quiver or rigor like shaking of one leg during bed side shift report.  Shortly after, sitter called out for nurse.  Pt was laying on his right side in fetal position, clenched down, displaying seizure like symptoms.  Pt was nonverbal when asked questions.  After administering ativan 2mg , pt seemed calm. When asked about his children (diversion questions), pt quickly answered and stated tearfully , "I miss my kids and want to see them".  Pt was calm at that time as if seizures never occurred.  Notified MD and plans to transfer to higher level of care.   ?VS stable .  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   ?

## 2021-05-04 NOTE — Significant Event (Signed)
Rapid Response Event Note  ? ?Reason for Call :  ? ? ?Initial Focused Assessment:  ? ?Rapid response called but soon canceled by charge nurse per [facility]. ? ? ? ?Interventions:  ? ? ?Plan of Care:  ? ? ? ?Event Summary:  ? ?MD Notified:  ?Call Time: ?Arrival Time: ?End Time: ? ?Cele Mote J, RN ?

## 2021-05-04 NOTE — Progress Notes (Signed)
Pt has had several intermittment seizures since morning shift.  Dr. Selina Cooley notified and present at times of seizures.  Last one noted started at 1132, last more than 5 min, ativan 2 mg given.  After ten minutes, pt seized again contracting to the right side in fetal position, legs twitching/jerking.  VS taken, Hypotensive 98/68 and ST of 100s.  NS bolus started.  Orders executed for IV keppra.  Pt seized for the third time around 1210.  Orders to transfer pt to ICU 13.  VS stable at this time.  Report given and orders executed.   ?
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
## 2021-05-04 NOTE — Progress Notes (Signed)
?  Chaplain On-Call responded to Rapid Response notification for patient in room 124. ? ?RR Team was providing interventions for the patient, and their decision was to move the patient to the ICU bed 13. ? ?Chaplain met patient's friend Dalphine Handing after the RR, and provided spiritual and emotional support for her in the ICU Waiting Room. ? ?Dalphine Handing described patient's situation with frequent seizures, and the need for continuous monitoring, which will result in the patient being transferred to [facility] later today. ? ?Chaplain referred the patient and friend to [place] for follow up support if requested. ? ?Chaplain Pollyann Samples ?Gerilyn Pilgrim., [place] ?

## 2021-05-04 NOTE — Progress Notes (Signed)
Called to patient's room for a seizure.  Started talking to patient and he began to smile at me.  I explained to him that he needs to quit pretending to have a seizure because people having seizures are not able to speak to [place] or pull out Ivs.   ?

## 2021-05-04 NOTE — Significant Event (Signed)
Rapid Response Event Note  ? ?Reason for Call :  ?Seizure activity ? ?Initial Focused Assessment:  ? ?Per bedside RN- pt having 3 seizures within the past hr. Vitals stable. ? ? ? ?Interventions:  ?Bedside RN had given the patient ativan prior to my arrival.  Dr. Nelson Chimes at bedside- stated that patient has orders to transfer to [facility].   ? ?Plan of Care:  ? ?While pending transfer to [facility]- Dr. Nelson Chimes placed orders to transfer pt to stepdown. ? ?Event Summary:  ? ?MD Notified:  ?Call Time: ?Arrival Time: ?End Time: ? ?Jontae Sonier J, RN ?

## 2021-05-05 ENCOUNTER — Other Ambulatory Visit: Payer: Self-pay

## 2021-05-28 ENCOUNTER — Telehealth: Payer: Self-pay | Admitting: Pharmacist

## 2021-05-28 NOTE — Telephone Encounter (Signed)
Patient failed to provide requested 2022 financial documentation. No additional medication assistance will be provided by [facility] without the required proof of income documentation. Patient notified by letter. Debra Cheek Administrative Assistant [facility] 

## 2021-06-02 ENCOUNTER — Other Ambulatory Visit: Payer: Self-pay

## 2024-02-09 ENCOUNTER — Emergency Department
Admission: EM | Admit: 2024-02-09 | Discharge: 2024-02-09 | Disposition: A | Discharge status: Home / Self Care | Payer: Self-pay | Attending: Emergency Medicine | Admitting: Emergency Medicine

## 2024-02-09 ENCOUNTER — Other Ambulatory Visit: Payer: Self-pay

## 2024-02-09 DIAGNOSIS — R569 Unspecified convulsions: Secondary | ICD-10-CM

## 2024-02-09 DIAGNOSIS — G40909 Epilepsy, unspecified, not intractable, without status epilepticus: Secondary | ICD-10-CM | POA: Insufficient documentation

## 2024-02-09 LAB — CBC WITH DIFFERENTIAL/PLATELET
Abs Immature Granulocytes: 0.02 K/uL (ref 0.00–0.07)
Basophils Absolute: 0 K/uL (ref 0.0–0.1)
Basophils Relative: 0 %
Eosinophils Absolute: 0 K/uL (ref 0.0–0.5)
Eosinophils Relative: 0 %
HCT: 42.7 % (ref 39.0–52.0)
Hemoglobin: 14 g/dL (ref 13.0–17.0)
Immature Granulocytes: 0 %
Lymphocytes Relative: 17 %
Lymphs Abs: 1.3 K/uL (ref 0.7–4.0)
MCH: 33.3 pg (ref 26.0–34.0)
MCHC: 32.8 g/dL (ref 30.0–36.0)
MCV: 101.4 fL — ABNORMAL HIGH (ref 80.0–100.0)
Monocytes Absolute: 0.3 K/uL (ref 0.1–1.0)
Monocytes Relative: 4 %
Neutro Abs: 5.9 K/uL (ref 1.7–7.7)
Neutrophils Relative %: 79 %
Platelets: 258 K/uL (ref 150–400)
RBC: 4.21 MIL/uL — ABNORMAL LOW (ref 4.22–5.81)
RDW: 14 % (ref 11.5–15.5)
WBC: 7.6 K/uL (ref 4.0–10.5)
nRBC: 0 % (ref 0.0–0.2)

## 2024-02-09 LAB — COMPREHENSIVE METABOLIC PANEL WITH GFR
ALT: 16 U/L (ref 0–44)
AST: 25 U/L (ref 15–41)
Albumin: 4.7 g/dL (ref 3.5–5.0)
Alkaline Phosphatase: 80 U/L (ref 38–126)
Anion gap: 13 (ref 5–15)
BUN: 15 mg/dL (ref 6–20)
CO2: 19 mmol/L — ABNORMAL LOW (ref 22–32)
Calcium: 9.3 mg/dL (ref 8.9–10.3)
Chloride: 105 mmol/L (ref 98–111)
Creatinine, Ser: 1.43 mg/dL — ABNORMAL HIGH (ref 0.61–1.24)
GFR, Estimated: >60 mL/min
Glucose, Bld: 100 mg/dL — ABNORMAL HIGH (ref 70–99)
Potassium: 3.3 mmol/L — ABNORMAL LOW (ref 3.5–5.1)
Sodium: 138 mmol/L (ref 135–145)
Total Bilirubin: 0.6 mg/dL (ref 0.0–1.2)
Total Protein: 7.5 g/dL (ref 6.5–8.1)

## 2024-02-09 LAB — ETHANOL: Alcohol, Ethyl (B): <15 mg/dL

## 2024-02-09 MED ORDER — LEVETIRACETAM (KEPPRA) 500 MG/5 ML ADULT IV PUSH
1500.0000 mg | Freq: Once | INTRAVENOUS | Status: AC
  Administered 2024-02-09: 1500 mg via INTRAVENOUS
  Filled 2024-02-09: qty 15

## 2024-02-09 MED ORDER — LORAZEPAM 2 MG/ML IJ SOLN
2.0000 mg | Freq: Once | INTRAMUSCULAR | Status: AC
  Administered 2024-02-09: 2 mg via INTRAVENOUS
  Filled 2024-02-09: qty 1

## 2024-02-09 MED ORDER — LEVETIRACETAM 750 MG PO TABS: 1500.0000 mg | ORAL_TABLET | Freq: Two times a day (BID) | ORAL | 5 refills | Status: AC

## 2024-02-09 NOTE — ED Notes (Signed)
 Staff in room as family member stated pt having a seizure. Mel RN rolled pt on his side, pt immediately responsive and talking. Pt did not have post ictal phase.

## 2024-02-09 NOTE — ED Notes (Signed)
 MD Siadecki to bedside

## 2024-02-09 NOTE — ED Provider Notes (Signed)
 "  [facility] Provider Note    Event Date/Time   First MD Initiated Contact with Patient 02/09/24 1542     (approximate)   History   Seizures   HPI  Nicholas Reese is a 43 y.o. male with history of seizure disorder, depression, and anxiety who presents with multiple seizures today.  Per his family they got an alert around midday that he had a seizure.  He typically gets mild brief seizures almost every day, but will get clusters of like this less frequently.  Per the family and EMS he has  had multiple seizures during transport.  The patient is somnolent and unable to give any history.  The family is concerned that he may not be compliant with his Keppra .  I reviewed the past medical records.  The patient was admitted to the hospital service here in 2023 with seizures.  However at that time there was also concern for pseudoseizures.  He was recommended for transfer to [facility] for continuous EEG monitoring, but then left AMA before transfer.   Physical Exam   Triage Vital Signs: ED Triage Vitals  Encounter Vitals Group     BP 02/09/24 1300 114/85     Girls Systolic BP Percentile --      Girls Diastolic BP Percentile --      Boys Systolic BP Percentile --      Boys Diastolic BP Percentile --      Pulse Rate 02/09/24 1300 (!) 103     Resp 02/09/24 1300 20     Temp 02/09/24 1300 98 F (36.7 C)     Temp src --      SpO2 02/09/24 1300 99 %     Weight 02/09/24 1253 189 lb 9.5 oz (86 kg)     Height 02/09/24 1253 5' 10 (1.778 m)     Head Circumference --      Peak Flow --      Pain Score 02/09/24 1253 7     Pain Loc --      Pain Education --      Exclude from Growth Chart --     Most recent vital signs: Vitals:   02/09/24 1530 02/09/24 1545  BP: 96/69   Pulse: 86 83  Resp:    Temp:    SpO2: 98% 98%     General: Somnolent, arousable. CV:  Good peripheral perfusion.  Resp:  Normal effort.  Abd:  No distention.  Other:  EOMI.  PERRLA.   No facial droop.  Following basic commands like squeezing hands.  Motor intact in all extremities.   ED Results / Procedures / Treatments   Labs (all labs ordered are listed, but only abnormal results are displayed) Labs Reviewed  COMPREHENSIVE METABOLIC PANEL WITH GFR - Abnormal; Notable for the following components:      Result Value   Potassium 3.3 (*)    CO2 19 (*)    Glucose, Bld 100 (*)    Creatinine, Ser 1.43 (*)    All other components within normal limits  CBC WITH DIFFERENTIAL/PLATELET - Abnormal; Notable for the following components:   RBC 4.21 (*)    MCV 101.4 (*)    All other components within normal limits  ETHANOL  URINE DRUG SCREEN  LEVETIRACETAM  LEVEL  CBG MONITORING, ED     EKG   RADIOLOGY   PROCEDURES:  Critical Care performed: No  Procedures   MEDICATIONS ORDERED IN ED: Medications  levETIRAcetam  (KEPPRA ) undiluted  injection 1,500 mg (1,500 mg Intravenous Given 02/09/24 1322)  LORazepam  (ATIVAN ) injection 2 mg (2 mg Intravenous Given 02/09/24 1407)     IMPRESSION / MDM / ASSESSMENT AND PLAN / ED COURSE  I reviewed the triage vital signs and the nursing notes.  43 year old male with a history of seizures on Keppra , unknown compliance, presents with multiple apparently seizure-like episodes.  The description of the episodes is unclear.  Currently, the patient is somnolent but arousable.  He is following commands like squeezing hands and is able to move all extremities.  Neurologic exam is limited due to his mental status but nonfocal.  Differential diagnosis includes, but is not limited to, seizure with postictal state, pseudoseizure, electrolyte abnormality, other metabolic cause, substance use.  We will obtain basic labs and send off a Keppra  level.  At this time given the nonfocal neurologic exam and lack of trauma there is no indication for imaging.  I have ordered IV Keppra  and Ativan  for control of these apparent seizure-like episodes.  On  the purposeful movements and following commands, there is no evidence of nonconvulsive status at this time.  Depending on whether the patient returns to baseline or continues to have seizure-like activity he may require admission.  Patient's presentation is most consistent with acute presentation with potential threat to life or bodily function.  The patient is on the cardiac monitor to evaluate for evidence of arrhythmia and/or significant heart rate changes.  ----------------------------------------- 3:45 PM on 02/09/2024 -----------------------------------------  Lab workup is unremarkable.  CMP shows no acute findings.  CBC is also unremarkable.  Ethanol level is negative.  Keppra  level is pending.  The patient is pending reassessment after medications.  I have signed him out to the oncoming ED physician Dr. Dorothyann.   FINAL CLINICAL IMPRESSION(S) / ED DIAGNOSES   Final diagnoses:  Seizure-like activity (HCC)     Rx / DC Orders   ED Discharge Orders     None        Note:  This document was prepared using Dragon voice recognition software and may include unintentional dictation errors.    Jacolyn Pae, MD 02/09/24 1609  "

## 2024-02-09 NOTE — ED Notes (Signed)
 Pt with several attempts to take out iv. Pt's family at bedside redirecting patient.

## 2024-02-09 NOTE — ED Provider Notes (Signed)
----------------------------------------- °  6:28 PM on 02/09/2024 ----------------------------------------- Patient care assumed from Dr. Jacolyn.  Patient states he is feeling much better.  Patient states he has been out of his Keppra  for several months.  Patient is post be taking 1500 mg twice a day he has been loaded with Keppra  in the emergency department.  Patient also has a history of pseudoseizures he did at 1 point have a brief seizure where he rolled on his side however when I went in the room and talk to him he was able to talk to me immediately without any postictal period.  Suggesting more likely a pseudoseizure/nonepileptic seizure.  Patient states he wants to go home patient's family members are here with him who will take him home and watch him.  The patient's family member states they will pick up the patient's Keppra  prescription on the way home.  His lab work today shows no significant findings chemistry is reassuring CBC reassuring and alcohol level is negative.  As the patient currently appears well and is requesting to go home we will discharge patient home.   Dorothyann Drivers, MD 02/09/24 [phone]

## 2024-02-09 NOTE — ED Triage Notes (Signed)
 Pt comes in via [facility] from home with complaints of seizures. According to EMS, had one seizure with family, and six seizures with fire. Pt is prescribed Keppra  but hasn't been compliant with medication. Pt is alert and oriented x4, with nlo signs of acute distress at this time.

## 2024-02-09 NOTE — ED Notes (Signed)
 Pt placed in safety mittens.

## 2024-02-09 NOTE — ED Notes (Signed)
 Pt's family at bedside, and spoke with this RN about concerns of pt's mental status at this time. This Rn to bedside  and attempting top wake pt. Pt not responsive to voice or sternal rub. Pt's vital sign within normal range, and Pt HR NSR. MD Siadecki notified.

## 2024-02-10 LAB — LEVETIRACETAM LEVEL: Levetiracetam Lvl: <2 ug/mL — ABNORMAL LOW (ref 10.0–40.0)

## 2024-03-15 ENCOUNTER — Emergency Department: Payer: Self-pay

## 2024-03-15 ENCOUNTER — Other Ambulatory Visit: Payer: Self-pay

## 2024-03-15 ENCOUNTER — Emergency Department
Admission: EM | Admit: 2024-03-15 | Discharge: 2024-03-15 | Disposition: A | Discharge status: Home / Self Care | Payer: Self-pay | Attending: Emergency Medicine | Admitting: Emergency Medicine

## 2024-03-15 DIAGNOSIS — R569 Unspecified convulsions: Secondary | ICD-10-CM | POA: Insufficient documentation

## 2024-03-15 LAB — BASIC METABOLIC PANEL WITH GFR
Anion gap: 11 (ref 5–15)
BUN: 11 mg/dL (ref 6–20)
CO2: 19 mmol/L — ABNORMAL LOW (ref 22–32)
Calcium: 8.6 mg/dL — ABNORMAL LOW (ref 8.9–10.3)
Chloride: 109 mmol/L (ref 98–111)
Creatinine, Ser: 1.32 mg/dL — ABNORMAL HIGH (ref 0.61–1.24)
GFR, Estimated: >60 mL/min
Glucose, Bld: 90 mg/dL (ref 70–99)
Potassium: 3.3 mmol/L — ABNORMAL LOW (ref 3.5–5.1)
Sodium: 139 mmol/L (ref 135–145)

## 2024-03-15 LAB — URINALYSIS, ROUTINE W REFLEX MICROSCOPIC
Bilirubin Urine: NEGATIVE
Glucose, UA: NEGATIVE mg/dL
Hgb urine dipstick: NEGATIVE
Ketones, ur: NEGATIVE mg/dL
Leukocytes,Ua: NEGATIVE
Nitrite: NEGATIVE
Protein, ur: 30 mg/dL — AB
Specific Gravity, Urine: 1.016 (ref 1.005–1.030)
Squamous Epithelial / HPF: 0 /HPF (ref 0–5)
pH: 5 (ref 5.0–8.0)

## 2024-03-15 LAB — HEPATIC FUNCTION PANEL
ALT: 13 U/L (ref 0–44)
AST: 16 U/L (ref 15–41)
Albumin: 4.2 g/dL (ref 3.5–5.0)
Alkaline Phosphatase: 88 U/L (ref 38–126)
Bilirubin, Direct: <0.1 mg/dL (ref 0.0–0.2)
Total Bilirubin: <0.2 mg/dL (ref 0.0–1.2)
Total Protein: 6.3 g/dL — ABNORMAL LOW (ref 6.5–8.1)

## 2024-03-15 LAB — URINE DRUG SCREEN
Amphetamines: NEGATIVE
Barbiturates: NEGATIVE
Benzodiazepines: NEGATIVE
Cocaine: NEGATIVE
Fentanyl: NEGATIVE
Methadone Scn, Ur: NEGATIVE
Opiates: NEGATIVE
Tetrahydrocannabinol: NEGATIVE

## 2024-03-15 LAB — CBC WITH DIFFERENTIAL/PLATELET
Abs Immature Granulocytes: 0.03 K/uL (ref 0.00–0.07)
Basophils Absolute: 0 K/uL (ref 0.0–0.1)
Basophils Relative: 1 %
Eosinophils Absolute: 0.1 K/uL (ref 0.0–0.5)
Eosinophils Relative: 1 %
HCT: 39.8 % (ref 39.0–52.0)
Hemoglobin: 13.5 g/dL (ref 13.0–17.0)
Immature Granulocytes: 1 %
Lymphocytes Relative: 21 %
Lymphs Abs: 1.4 K/uL (ref 0.7–4.0)
MCH: 33.8 pg (ref 26.0–34.0)
MCHC: 33.9 g/dL (ref 30.0–36.0)
MCV: 99.7 fL (ref 80.0–100.0)
Monocytes Absolute: 0.4 K/uL (ref 0.1–1.0)
Monocytes Relative: 6 %
Neutro Abs: 4.6 K/uL (ref 1.7–7.7)
Neutrophils Relative %: 70 %
Platelets: 235 K/uL (ref 150–400)
RBC: 3.99 MIL/uL — ABNORMAL LOW (ref 4.22–5.81)
RDW: 13.2 % (ref 11.5–15.5)
WBC: 6.5 K/uL (ref 4.0–10.5)
nRBC: 0 % (ref 0.0–0.2)

## 2024-03-15 LAB — BLOOD GAS, VENOUS
Acid-base deficit: 6 mmol/L — ABNORMAL HIGH (ref 0.0–2.0)
Bicarbonate: 19.6 mmol/L — ABNORMAL LOW (ref 20.0–28.0)
O2 Saturation: 88.2 %
Patient temperature: 37
pCO2, Ven: 38 mmHg — ABNORMAL LOW (ref 44–60)
pH, Ven: 7.32 (ref 7.25–7.43)
pO2, Ven: 49 mmHg — ABNORMAL HIGH (ref 32–45)

## 2024-03-15 MED ORDER — PHENYTOIN 50 MG PO CHEW
50.0000 mg | CHEWABLE_TABLET | Freq: Once | ORAL | Status: AC
  Administered 2024-03-15: 50 mg via ORAL
  Filled 2024-03-15: qty 1

## 2024-03-15 MED ORDER — LEVETIRACETAM (KEPPRA) 500 MG/5 ML ADULT IV PUSH
1500.0000 mg | Freq: Once | INTRAVENOUS | Status: AC
  Administered 2024-03-15: 1500 mg via INTRAVENOUS
  Filled 2024-03-15: qty 15

## 2024-03-15 NOTE — ED Triage Notes (Signed)
 Pt to ED via [facility] from home for c/o seizures. Pt ran out of Keppra  3 days ago. Three seizures witnessed by fire dept. On EMS arrival, pt post ictal and disoriented.

## 2024-03-15 NOTE — Discharge Instructions (Addendum)
 No driving for a minimum of 6 months.  Certain to see and follow-up with neurologist and your primary doctor prior to any decision to resume driving.

## 2024-03-15 NOTE — ED Notes (Signed)
 Pt noted to walk out of room and said his ride was here, refused discharge vitals

## 2024-03-15 NOTE — ED Provider Notes (Signed)
" ° °  Received in care from Dr. Nicholaus  Possible seizure w/ postictal state. No alcohol.    Keppra  load 1500mg  given in ER  02/19/24 had similar presentations  Head CT    CT Head Wo Contrast Result Date: 03/15/2024 EXAM: CT HEAD WITHOUT CONTRAST 03/15/2024 02:56:04 PM TECHNIQUE: CT of the head was performed without the administration of intravenous contrast. Automated exposure control, iterative reconstruction, and/or weight based adjustment of the mA/kV was utilized to reduce the radiation dose to as low as reasonably achievable. COMPARISON: 04/29/2021 CLINICAL HISTORY: Recurrent seizures. FINDINGS: BRAIN AND VENTRICLES: No acute hemorrhage. No evidence of acute infarct. No hydrocephalus. No extra-axial collection. No mass effect or midline shift. ORBITS: No acute abnormality. SINUSES: No acute abnormality. SOFT TISSUES AND SKULL: No acute soft tissue abnormality. No skull fracture. IMPRESSION: 1. No acute intracranial abnormality. Electronically signed by: Donnice Mania MD 03/15/2024 03:23 PM EST RP Workstation: [id number]     Vitals:   03/15/24 1712 03/15/24 1729  BP:    Pulse: 85   Resp:    Temp:  98.9 F (37.2 C)  SpO2: 100%       ----------------------------------------- 6:43 PM on 03/15/2024 ----------------------------------------- Patient fully awake alert well-oriented very pleasant.  He is in no distress has no concerns.  Reports that he is going to be scheduling appoint with his neurologist coming up in about a week.  He has a doctor to follow-up with.  He advises he is missed 3 days of his seizure medicine he will resume those tomorrow morning.  He was given Keppra  here.  He appears well alert oriented pleasant.  No noted tremors.  Vital signs normal.  No clinical findings of suggest an acute withdrawal type syndrome etc.  Reports noncompliance with his medicine for 3 days preceding this event  He advises he does not need refills he has ample medication and has plan to  follow-up with neurology in about a week  He does not drive his son will be giving out.  Counseled him on seizures, need for medication compliance, return precautions.  Return precautions and treatment recommendations and follow-up discussed with the patient who is agreeable with the plan.    Dicky Anes, MD 03/15/24 1844  "

## 2024-03-15 NOTE — ED Notes (Signed)
 Pt is not wanting to keep cardiac leads on at the moment

## 2024-03-15 NOTE — ED Provider Notes (Signed)
 "  [facility] Provider Note    Event Date/Time   First MD Initiated Contact with Patient 03/15/24 1507     (approximate)   History   Seizures   HPI  Nicholas Reese is a 44 y.o. male with possible epilepsy and also nonepileptic events, anxiety, depression who presents with multiple seizure-like events today.  Patient states that he has been out of his Keppra  for 3 days.  History is largely obtained by EMS on arrival as patient is lethargic.  Per EMS, the patient called them in between seizure-like events.  No medications were given by EMS en route.  Patient denies any neck pain chest pain abdominal pain changes in urinary or bowel habits.  He closes his eyes and falls asleep while I am in the room  Patient was last seen on 02/09/2024 for similar presentation.  Patient was seen to have nonepileptic events while in the emergency department at that time.     Physical Exam   Triage Vital Signs: ED Triage Vitals  Encounter Vitals Group     BP 03/15/24 1342 (!) 151/92     Girls Systolic BP Percentile --      Girls Diastolic BP Percentile --      Boys Systolic BP Percentile --      Boys Diastolic BP Percentile --      Pulse Rate 03/15/24 1342 (!) 103     Resp 03/15/24 1342 19     Temp 03/15/24 1342 99 F (37.2 C)     Temp Source 03/15/24 1342 Oral     SpO2 03/15/24 1342 100 %     Weight 03/15/24 1346 185 lb (83.9 kg)     Height 03/15/24 1346 6' (1.829 m)     Head Circumference --      Peak Flow --      Pain Score 03/15/24 1342 5     Pain Loc --      Pain Education --      Exclude from Growth Chart --     Most recent vital signs: Vitals:   03/15/24 1342  BP: (!) 151/92  Pulse: (!) 103  Resp: 19  Temp: 99 F (37.2 C)  SpO2: 100%    Nursing Triage Note reviewed. Vital signs reviewed and patients oxygen saturation is normoxic  General: Patient is well nourished, well developed, awake and alert, resting comfortably in no acute distress Head:  Normocephalic and atraumatic Some abrasions over patient's right cheek Eyes: Normal inspection, extraocular muscles intact, no conjunctival pallor Ear, nose, throat: Normal external exam Neck: Normal range of motion Respiratory: Patient is in no respiratory distress, lungs CTAB Cardiovascular: Patient is not tachycardic, RRR without murmur appreciated GI: Abd SNT with no guarding or rebound  Back: Normal inspection of the back with good strength and range of motion throughout all ext Extremities: pulses intact with good cap refills, no LE pitting edema or calf tenderness Neuro: The patient is lethargic, does move all extremities, sensation grossly intact, oriented x 3, does have some mild tongue fasciculations   ED Results / Procedures / Treatments   Labs (all labs ordered are listed, but only abnormal results are displayed) Labs Reviewed  CBC WITH DIFFERENTIAL/PLATELET - Abnormal; Notable for the following components:      Result Value   RBC 3.99 (*)    All other components within normal limits  BASIC METABOLIC PANEL WITH GFR - Abnormal; Notable for the following components:   Potassium 3.3 (*)  CO2 19 (*)    Creatinine, Ser 1.32 (*)    Calcium  8.6 (*)    All other components within normal limits  BLOOD GAS, VENOUS - Abnormal; Notable for the following components:   pCO2, Ven 38 (*)    pO2, Ven 49 (*)    Bicarbonate 19.6 (*)    Acid-base deficit 6.0 (*)    All other components within normal limits  HEPATIC FUNCTION PANEL - Abnormal; Notable for the following components:   Total Protein 6.3 (*)    All other components within normal limits  URINE DRUG SCREEN  URINALYSIS, ROUTINE W REFLEX MICROSCOPIC  PHENYTOIN  LEVEL, FREE AND TOTAL     EKG EKG and rhythm strip are interpreted by myself:   EKG: [Normal sinus rhythm] at heart rate of 98, normal QRS duration, QTc 459, nonspecific ST segments and T waves no ectopy EKG not consistent with Acute STEMI Rhythm strip: NSR in  lead II   RADIOLOGY CT head: No intracranial hemorrhage on my independent review interpretation radiologist agrees    PROCEDURES:  Critical Care performed: No  Procedures   MEDICATIONS ORDERED IN ED: Medications  levETIRAcetam  (KEPPRA ) undiluted injection 1,500 mg (1,500 mg Intravenous Given 03/15/24 1403)  phenytoin  (DILANTIN ) chewable tablet 50 mg (50 mg Oral Given 03/15/24 1439)     IMPRESSION / MDM / ASSESSMENT AND PLAN / ED COURSE                                Differential diagnosis includes, but is not limited to, medication noncompliance, epilepsy, nonepileptic events, anemia, electrolyte derangement, intracranial hemorrhage   ED course: Patient presents lethargic however I appreciate no focal neurological deficits.  I did review the ED physicians note from 02/09/2024.  Despite having multiple seizures his blood gas is not deviated he has no anemia or profound electrolyte derangements.  His creatinine seems to be at baseline for him (better than last ED visit).  At arrival I did deliver 1500 mg of IV Keppra .  CT head has been completed but radiology read is pending.  He was signed out to oncoming physician pending this and reassessment   Clinical Course as of 03/15/24 1525  Fri Mar 15, 2024  1524 Blood gas, venous(!) No deviation [HD]  1524 CBC with Differential(!) No leukocytosis or acute anemia [HD]  1524 Basic metabolic panel(!) No profound electrolyte derangements and creatinine at baseline [HD]    Clinical Course User Index [HD] Nicholaus Rolland BRAVO, MD   -- Risk: 5 This patient has a high risk of morbidity due to further diagnostic testing or treatment. Rationale: This patients evaluation and management involve a high risk of morbidity due to the potential severity of presenting symptoms, need for diagnostic testing, and/or initiation of treatment that may require close monitoring. The differential includes conditions with potential for significant  deterioration or requiring escalation of care. Treatment decisions in the ED, including medication administration, procedural interventions, or disposition planning, reflect this level of risk. COPA: 5 The patient has the following acute or chronic illness/injury that poses a possible threat to life or bodily function: [X] : The patient has a potentially serious acute condition or an acute exacerbation of a chronic illness requiring urgent evaluation and management in the Emergency Department. The clinical presentation necessitates immediate consideration of life-threatening or function-threatening diagnoses, even if they are ultimately ruled out.   FINAL CLINICAL IMPRESSION(S) / ED DIAGNOSES   Final diagnoses:  Seizure-like  activity (HCC)     Rx / DC Orders   ED Discharge Orders     None        Note:  This document was prepared using Dragon voice recognition software and may include unintentional dictation errors.   Nicholaus Rolland BRAVO, MD 03/15/24 1525  "

## 2024-03-16 LAB — PHENYTOIN LEVEL, FREE AND TOTAL
Phenytoin, Free: NOT DETECTED ug/mL (ref 1.0–2.0)
Phenytoin, Total: 0.9 ug/mL — ABNORMAL LOW (ref 10.0–20.0)

## 2024-03-29 ENCOUNTER — Emergency Department
Admission: EM | Admit: 2024-03-29 | Discharge: 2024-03-29 | Disposition: A | Discharge status: Home / Self Care | Payer: Self-pay | Source: Home / Self Care | Attending: Emergency Medicine | Admitting: Emergency Medicine

## 2024-03-29 ENCOUNTER — Other Ambulatory Visit: Payer: Self-pay

## 2024-03-29 ENCOUNTER — Emergency Department: Payer: Self-pay

## 2024-03-29 DIAGNOSIS — R569 Unspecified convulsions: Secondary | ICD-10-CM

## 2024-03-29 LAB — COMPREHENSIVE METABOLIC PANEL WITH GFR
ALT: 13 U/L (ref 0–44)
AST: 19 U/L (ref 15–41)
Albumin: 4.3 g/dL (ref 3.5–5.0)
Alkaline Phosphatase: 99 U/L (ref 38–126)
Anion gap: 12 (ref 5–15)
BUN: 11 mg/dL (ref 6–20)
CO2: 18 mmol/L — ABNORMAL LOW (ref 22–32)
Calcium: 9 mg/dL (ref 8.9–10.3)
Chloride: 108 mmol/L (ref 98–111)
Creatinine, Ser: 1.17 mg/dL (ref 0.61–1.24)
GFR, Estimated: >60 mL/min
Glucose, Bld: 106 mg/dL — ABNORMAL HIGH (ref 70–99)
Potassium: 2.8 mmol/L — ABNORMAL LOW (ref 3.5–5.1)
Sodium: 138 mmol/L (ref 135–145)
Total Bilirubin: <0.2 mg/dL (ref 0.0–1.2)
Total Protein: 6.8 g/dL (ref 6.5–8.1)

## 2024-03-29 LAB — CBC WITH DIFFERENTIAL/PLATELET
Abs Immature Granulocytes: 0.02 10*3/uL (ref 0.00–0.07)
Basophils Absolute: 0 10*3/uL (ref 0.0–0.1)
Basophils Relative: 1 %
Eosinophils Absolute: 0 10*3/uL (ref 0.0–0.5)
Eosinophils Relative: 1 %
HCT: 41.7 % (ref 39.0–52.0)
Hemoglobin: 14.7 g/dL (ref 13.0–17.0)
Immature Granulocytes: 0 %
Lymphocytes Relative: 39 %
Lymphs Abs: 2.7 10*3/uL (ref 0.7–4.0)
MCH: 34.4 pg — ABNORMAL HIGH (ref 26.0–34.0)
MCHC: 35.3 g/dL (ref 30.0–36.0)
MCV: 97.7 fL (ref 80.0–100.0)
Monocytes Absolute: 0.4 10*3/uL (ref 0.1–1.0)
Monocytes Relative: 6 %
Neutro Abs: 3.8 10*3/uL (ref 1.7–7.7)
Neutrophils Relative %: 53 %
Platelets: 245 10*3/uL (ref 150–400)
RBC: 4.27 MIL/uL (ref 4.22–5.81)
RDW: 13.1 % (ref 11.5–15.5)
WBC: 6.9 10*3/uL (ref 4.0–10.5)
nRBC: 0 % (ref 0.0–0.2)

## 2024-03-29 MED ORDER — POTASSIUM CHLORIDE CRYS ER 20 MEQ PO TBCR
40.0000 meq | EXTENDED_RELEASE_TABLET | Freq: Once | ORAL | Status: AC
  Administered 2024-03-29: 40 meq via ORAL
  Filled 2024-03-29: qty 2

## 2024-03-29 MED ORDER — PHENYTOIN SODIUM EXTENDED 30 MG PO CAPS
150.0000 mg | ORAL_CAPSULE | Freq: Once | ORAL | Status: AC
  Administered 2024-03-29: 150 mg via ORAL
  Filled 2024-03-29: qty 5

## 2024-03-29 MED ORDER — LEVETIRACETAM (KEPPRA) 500 MG/5 ML ADULT IV PUSH
1500.0000 mg | Freq: Once | INTRAVENOUS | Status: AC
  Administered 2024-03-29: 1500 mg via INTRAVENOUS
  Filled 2024-03-29: qty 15

## 2024-03-29 NOTE — ED Triage Notes (Signed)
 Pt BIB [facility] from home c/o multiple generalized seizures. Pt states he ran out of his keppra  for a few weeks. Had one witnessed seizure lasting one minute with EMS.  5MG  IM versed  given by EMS.  EMS Vitals: 94% RA, 90 hr, 163/90, 100% 6L, 85 CBG

## 2024-03-29 NOTE — ED Notes (Signed)
 Fall bundle and seizure pads in place

## 2024-03-29 NOTE — ED Notes (Signed)
 This RN found pt lying adjacent to bed on his side. Pt had to be verbally aroused several times by his name before he responded. Pt stated he fell trying to get up. Pt got up on his own and walked to the bed with a steady gate. Fall alarms were in place prior to fall. No one witnessed fall or heard a fall. Pt has no acute complaints, denies injury. Fall alarm box was replaced and verified with sound initiation. Willo, MD notified and at bedside for reassessment.

## 2024-03-29 NOTE — ED Provider Notes (Signed)
 "  [facility] Provider Note    Event Date/Time   First MD Initiated Contact with Patient 03/29/24 [phone]     (approximate)   History   Chief Complaint Seizures   HPI  Nicholas Reese is a 44 y.o. male with past medical history of seizure disorder, anxiety, and depression who presents to the ED complaining of seizures.  Per EMS, patient had multiple generalized seizures at home and it is unclear exactly how many he had.  He was able to contact EMS between the episodes and had an additional generalized seizure during transport lasting about 1 minute, aborted with 5 mg IM Versed .  On arrival to the ED, he is awake and alert, complains of some headache and has a small amount of dried blood to his right frontal scalp.  He states he has been out of his seizure medication for at least the past 2 weeks, has otherwise been feeling well.     Physical Exam   Triage Vital Signs: ED Triage Vitals  Encounter Vitals Group     BP      Girls Systolic BP Percentile      Girls Diastolic BP Percentile      Boys Systolic BP Percentile      Boys Diastolic BP Percentile      Pulse      Resp      Temp      Temp src      SpO2      Weight      Height      Head Circumference      Peak Flow      Pain Score      Pain Loc      Pain Education      Exclude from Growth Chart     Most recent vital signs: Vitals:   03/29/24 0723 03/29/24 0724  BP:  (!) 143/93  Pulse:  92  Resp:  18  Temp:  98 F (36.7 C)  SpO2: 100% 100%    Constitutional: Alert and oriented. Eyes: Conjunctivae are normal. Head: Small abrasion to right frontal scalp with no hematomas or step-offs. Nose: No congestion/rhinnorhea. Mouth/Throat: Mucous membranes are moist.  Neck: No midline cervical spine tenderness to palpation. Cardiovascular: Normal rate, regular rhythm. Grossly normal heart sounds.  2+ radial pulses bilaterally. Respiratory: Normal respiratory effort.  No retractions. Lungs CTAB.   No chest wall tenderness to palpation. Gastrointestinal: Soft and nontender. No distention. Musculoskeletal: No lower extremity tenderness nor edema.  Neurologic:  Normal speech and language. No gross focal neurologic deficits are appreciated.    ED Results / Procedures / Treatments   Labs (all labs ordered are listed, but only abnormal results are displayed) Labs Reviewed  CBC WITH DIFFERENTIAL/PLATELET - Abnormal; Notable for the following components:      Result Value   MCH 34.4 (*)    All other components within normal limits  COMPREHENSIVE METABOLIC PANEL WITH GFR - Abnormal; Notable for the following components:   Potassium 2.8 (*)    CO2 18 (*)    Glucose, Bld 106 (*)    All other components within normal limits  PHENYTOIN  LEVEL, FREE AND TOTAL     EKG  ED ECG REPORT I, Carlin Palin, the attending physician, personally viewed and interpreted this ECG.   Date: 03/29/2024  EKG Time: 7:23  Rate: 87  Rhythm: normal sinus rhythm  Axis: Normal  Intervals:none  ST&T Change: None  RADIOLOGY CT head  reviewed and interpreted by me with no hemorrhage or midline shift.  PROCEDURES:  Critical Care performed: No  Procedures   MEDICATIONS ORDERED IN ED: Medications  levETIRAcetam  (KEPPRA ) undiluted injection 1,500 mg (1,500 mg Intravenous Given 03/29/24 0732)  phenytoin  (DILANTIN ) ER capsule 150 mg (150 mg Oral Given 03/29/24 0826)  potassium chloride  SA (KLOR-CON  M) CR tablet 40 mEq (40 mEq Oral Given 03/29/24 0856)     IMPRESSION / MDM / ASSESSMENT AND PLAN / ED COURSE  I reviewed the triage vital signs and the nursing notes.                              44 y.o. male with past medical history of seizures, anxiety, and depression who presents to the ED complaining of multiple seizures at home, between which he was able to contact EMS and had an additional seizure witnessed by EMS.  Patient's presentation is most consistent with acute presentation with potential  threat to life or bodily function.  Differential diagnosis includes, but is not limited to, seizure, status epilepticus, PNES, intracranial injury, cervical spine injury, electrolyte abnormality, arrhythmia.  Patient awake and alert on arrival to the ED and vital signs are unremarkable.  He complains of some headache and has dried blood to his right frontal scalp, otherwise nonfocal neurologic exam.  We will check CT head and cervical spine, give his home dose of Keppra  and Dilantin , check Dilantin  level.  Reviewing his chart, there has been some question whether his prior seizure-like activity has been epileptic versus PNES.  While he was admitted in 2023, arrangements were made for patient to be transferred to [facility] for spell characterization, but patient signed out AMA prior to this.  We will hold off on additional benzos at this time, labs are pending.  CT head and cervical spine are negative for acute process, labs remarkable for hypokalemia but otherwise reassuring without significant anemia, leukocytosis, or AKI.  LFTs are also unremarkable.  CT head and cervical spine are negative for acute finding, patient with no further seizure episodes here in the ED.  Patient was found laying on the floor of his room at one point, stated that he fell, but no signs of trauma on exam.  He was observed with no significant headache or other neurologic issues, do not feel repeat CT imaging needed at this time.  He is appropriate for discharge home, states he has a prescription for seizure medication available, just has not picked it up.  He was counseled to follow-up with neurology and referral provided, counseled to return to the ED for new or worsening symptoms.  Patient agrees with plan.      FINAL CLINICAL IMPRESSION(S) / ED DIAGNOSES   Final diagnoses:  Seizure-like activity (HCC)     Rx / DC Orders   ED Discharge Orders     None        Note:  This document was prepared using Dragon  voice recognition software and may include unintentional dictation errors.   Willo Dunnings, MD 03/29/24 [phone]  "

## 2024-03-29 NOTE — ED Notes (Signed)
 Pt to CT
